# Patient Record
Sex: Male | Born: 1955
Health system: Southern US, Community
[De-identification: ages and names within clinical notes are randomized; demographics above are authoritative.]

## PROBLEM LIST (undated history)

## (undated) DIAGNOSIS — I639 Cerebral infarction, unspecified: Secondary | ICD-10-CM

## (undated) DIAGNOSIS — I10 Essential (primary) hypertension: Secondary | ICD-10-CM

## (undated) DIAGNOSIS — K219 Gastro-esophageal reflux disease without esophagitis: Secondary | ICD-10-CM

## (undated) DIAGNOSIS — E785 Hyperlipidemia, unspecified: Secondary | ICD-10-CM

## (undated) HISTORY — PX: NO PAST SURGERIES: SHX2092

---

## 2012-01-04 ENCOUNTER — Emergency Department (HOSPITAL_COMMUNITY)
Admission: EM | Admit: 2012-01-04 | Discharge: 2012-01-04 | Disposition: A | Discharge status: Home / Self Care | Payer: Self-pay | Attending: Emergency Medicine | Admitting: Emergency Medicine

## 2012-01-04 ENCOUNTER — Encounter (HOSPITAL_COMMUNITY): Payer: Self-pay | Admitting: *Deleted

## 2012-01-04 DIAGNOSIS — I10 Essential (primary) hypertension: Secondary | ICD-10-CM | POA: Insufficient documentation

## 2012-01-04 DIAGNOSIS — R350 Frequency of micturition: Secondary | ICD-10-CM | POA: Insufficient documentation

## 2012-01-04 DIAGNOSIS — N400 Enlarged prostate without lower urinary tract symptoms: Secondary | ICD-10-CM | POA: Insufficient documentation

## 2012-01-04 DIAGNOSIS — Z8673 Personal history of transient ischemic attack (TIA), and cerebral infarction without residual deficits: Secondary | ICD-10-CM | POA: Insufficient documentation

## 2012-01-04 HISTORY — DX: Essential (primary) hypertension: I10

## 2012-01-04 HISTORY — DX: Cerebral infarction, unspecified: I63.9

## 2012-01-04 HISTORY — DX: Hyperlipidemia, unspecified: E78.5

## 2012-01-04 LAB — URINALYSIS, ROUTINE W REFLEX MICROSCOPIC
Glucose, UA: NEGATIVE mg/dL
Hgb urine dipstick: NEGATIVE
Leukocytes, UA: NEGATIVE
Specific Gravity, Urine: 1.022 (ref 1.005–1.030)
Urobilinogen, UA: 1 mg/dL (ref 0.0–1.0)

## 2012-01-04 LAB — DIFFERENTIAL
Basophils Absolute: 0 10*3/uL (ref 0.0–0.1)
Basophils Relative: 1 % (ref 0–1)
Eosinophils Absolute: 0.1 10*3/uL (ref 0.0–0.7)
Eosinophils Relative: 3 % (ref 0–5)
Monocytes Absolute: 0.2 10*3/uL (ref 0.1–1.0)

## 2012-01-04 LAB — BASIC METABOLIC PANEL
GFR calc Af Amer: >90 mL/min (ref >90)
GFR calc non Af Amer: >90 mL/min (ref >90)
Glucose, Bld: 97 mg/dL (ref 70–99)
Potassium: 3.9 mEq/L (ref 3.5–5.1)
Sodium: 137 mEq/L (ref 135–145)

## 2012-01-04 LAB — CBC
Hemoglobin: 13.8 g/dL (ref 13.0–17.0)
MCH: 23.7 pg — ABNORMAL LOW (ref 26.0–34.0)
Platelets: 171 10*3/uL (ref 150–400)
RBC: 5.83 MIL/uL — ABNORMAL HIGH (ref 4.22–5.81)
RDW: 16 % — ABNORMAL HIGH (ref 11.5–15.5)
WBC: 4.4 10*3/uL (ref 4.0–10.5)

## 2012-01-04 MED ORDER — LISINOPRIL-HYDROCHLOROTHIAZIDE 10-12.5 MG PO TABS: 1.0000 | ORAL_TABLET | Freq: Every day | ORAL | Status: DC

## 2012-01-04 NOTE — Discharge Instructions (Signed)
Call [phone] to try to set up an appointment with [facility]. It is very important for you to find a primary care doctor. Recheck your blood pressure in 1 week (you may go to the [facility]). Try to follow up with the urologist as soon as possible.   Hypertension Information As your heart beats, it forces blood through your arteries. This force is your blood pressure. If the pressure is too high, it is called hypertension (HTN) or high blood pressure. HTN is dangerous because you may have it and not know it. High blood pressure may mean that your heart has to work harder to pump blood. Your arteries may be narrow or stiff. The extra work puts you at risk for heart disease, stroke, and other problems.  Blood pressure consists of two numbers, a higher number over a lower, 110/72, for example. It is stated as "110 over 72." The ideal is below 120 for the top number (systolic) and under 80 for the bottom (diastolic).  You should pay close attention to your blood pressure if you have certain conditions such as:  Heart failure.   Prior heart attack.   Diabetes   Chronic kidney disease.   Prior stroke.   Multiple risk factors for heart disease.  To see if you have HTN, your blood pressure should be measured while you are seated with your arm held at the level of the heart. It should be measured at least twice. A one-time elevated blood pressure reading (especially in the Emergency Department) does not mean that you need treatment. There may be conditions in which the blood pressure is different between your right and left arms. It is important to see your caregiver soon for a recheck. Most people have essential hypertension which means that there is not a specific cause. This type of high blood pressure may be lowered by changing lifestyle factors such as:  Stress.   Smoking.   Lack of exercise.   Excessive weight.   Drug/tobacco/alcohol use.    Eating less salt.  Most people do not have symptoms from high blood pressure until it has caused damage to the body. Effective treatment can often prevent, delay or reduce that damage. TREATMENT  Treatment for high blood pressure, when a cause has been identified, is directed at the cause. There are a large number of medications to treat HTN. These fall into several categories, and your caregiver will help you select the medicines that are best for you. Medications may have side effects. You should review side effects with your caregiver. If your blood pressure stays high after you have made lifestyle changes or started on medicines,   Your medication(s) may need to be changed.   Other problems may need to be addressed.   Be certain you understand your prescriptions, and know how and when to take your medicine.   Be sure to follow up with your caregiver within the time frame advised (usually within two weeks) to have your blood pressure rechecked and to review your medications.   If you are taking more than one medicine to lower your blood pressure, make sure you know how and at what times they should be taken. Taking two medicines at the same time can result in blood pressure that is too low.  Document Released: 11/27/2005 Document Revised: 06/06/2011 Document Reviewed: 12/04/2007 [facility] Patient Information 2012 [place], [place].Hypertension Information As your heart beats, it forces blood through your arteries. This force is  your blood pressure. If the pressure is too high, it is called hypertension (HTN) or high blood pressure. HTN is dangerous because you may have it and not know it. High blood pressure may mean that your heart has to work harder to pump blood. Your arteries may be narrow or stiff. The extra work puts you at risk for heart disease, stroke, and other problems.  Blood pressure consists of two numbers, a higher number over a lower, 110/72, for example. It is stated as "110  over 72." The ideal is below 120 for the top number (systolic) and under 80 for the bottom (diastolic).  You should pay close attention to your blood pressure if you have certain conditions such as:  Heart failure.   Prior heart attack.   Diabetes   Chronic kidney disease.   Prior stroke.   Multiple risk factors for heart disease.  To see if you have HTN, your blood pressure should be measured while you are seated with your arm held at the level of the heart. It should be measured at least twice. A one-time elevated blood pressure reading (especially in the Emergency Department) does not mean that you need treatment. There may be conditions in which the blood pressure is different between your right and left arms. It is important to see your caregiver soon for a recheck. Most people have essential hypertension which means that there is not a specific cause. This type of high blood pressure may be lowered by changing lifestyle factors such as:  Stress.   Smoking.   Lack of exercise.   Excessive weight.   Drug/tobacco/alcohol use.   Eating less salt.  Most people do not have symptoms from high blood pressure until it has caused damage to the body. Effective treatment can often prevent, delay or reduce that damage. TREATMENT  Treatment for high blood pressure, when a cause has been identified, is directed at the cause. There are a large number of medications to treat HTN. These fall into several categories, and your caregiver will help you select the medicines that are best for you. Medications may have side effects. You should review side effects with your caregiver. If your blood pressure stays high after you have made lifestyle changes or started on medicines,   Your medication(s) may need to be changed.   Other problems may need to be addressed.   Be certain you understand your prescriptions, and know how and when to take your medicine.   Be sure to follow up with your  caregiver within the time frame advised (usually within two weeks) to have your blood pressure rechecked and to review your medications.   If you are taking more than one medicine to lower your blood pressure, make sure you know how and at what times they should be taken. Taking two medicines at the same time can result in blood pressure that is too low.  Document Released: 11/27/2005 Document Revised: 06/06/2011 Document Reviewed: 12/04/2007 [facility] Patient Information 2012 [place], [place].Hypertension Information As your heart beats, it forces blood through your arteries. This force is your blood pressure. If the pressure is too high, it is called hypertension (HTN) or high blood pressure. HTN is dangerous because you may have it and not know it. High blood pressure may mean that your heart has to work harder to pump blood. Your arteries may be narrow or stiff. The extra work puts you at risk for heart disease, stroke, and other problems.  Blood pressure consists of  two numbers, a higher number over a lower, 110/72, for example. It is stated as "110 over 72." The ideal is below 120 for the top number (systolic) and under 80 for the bottom (diastolic).  You should pay close attention to your blood pressure if you have certain conditions such as:  Heart failure.   Prior heart attack.   Diabetes   Chronic kidney disease.   Prior stroke.   Multiple risk factors for heart disease.  To see if you have HTN, your blood pressure should be measured while you are seated with your arm held at the level of the heart. It should be measured at least twice. A one-time elevated blood pressure reading (especially in the Emergency Department) does not mean that you need treatment. There may be conditions in which the blood pressure is different between your right and left arms. It is important to see your caregiver soon for a recheck. Most people have essential hypertension which means that there is not a  specific cause. This type of high blood pressure may be lowered by changing lifestyle factors such as:  Stress.   Smoking.   Lack of exercise.   Excessive weight.   Drug/tobacco/alcohol use.   Eating less salt.  Most people do not have symptoms from high blood pressure until it has caused damage to the body. Effective treatment can often prevent, delay or reduce that damage. TREATMENT  Treatment for high blood pressure, when a cause has been identified, is directed at the cause. There are a large number of medications to treat HTN. These fall into several categories, and your caregiver will help you select the medicines that are best for you. Medications may have side effects. You should review side effects with your caregiver. If your blood pressure stays high after you have made lifestyle changes or started on medicines,   Your medication(s) may need to be changed.   Other problems may need to be addressed.   Be certain you understand your prescriptions, and know how and when to take your medicine.   Be sure to follow up with your caregiver within the time frame advised (usually within two weeks) to have your blood pressure rechecked and to review your medications.   If you are taking more than one medicine to lower your blood pressure, make sure you know how and at what times they should be taken. Taking two medicines at the same time can result in blood pressure that is too low.  Document Released: 11/27/2005 Document Revised: 06/06/2011 Document Reviewed: 12/04/2007 [facility] Patient Information 2012 [place], [place].Hypertension Information As your heart beats, it forces blood through your arteries. This force is your blood pressure. If the pressure is too high, it is called hypertension (HTN) or high blood pressure. HTN is dangerous because you may have it and not know it. High blood pressure may mean that your heart has to work harder to pump blood. Your arteries may be narrow or  stiff. The extra work puts you at risk for heart disease, stroke, and other problems.  Blood pressure consists of two numbers, a higher number over a lower, 110/72, for example. It is stated as "110 over 72." The ideal is below 120 for the top number (systolic) and under 80 for the bottom (diastolic).  You should pay close attention to your blood pressure if you have certain conditions such as:  Heart failure.   Prior heart attack.   Diabetes   Chronic kidney disease.   Prior  stroke.   Multiple risk factors for heart disease.  To see if you have HTN, your blood pressure should be measured while you are seated with your arm held at the level of the heart. It should be measured at least twice. A one-time elevated blood pressure reading (especially in the Emergency Department) does not mean that you need treatment. There may be conditions in which the blood pressure is different between your right and left arms. It is important to see your caregiver soon for a recheck. Most people have essential hypertension which means that there is not a specific cause. This type of high blood pressure may be lowered by changing lifestyle factors such as:  Stress.   Smoking.   Lack of exercise.   Excessive weight.   Drug/tobacco/alcohol use.   Eating less salt.  Most people do not have symptoms from high blood pressure until it has caused damage to the body. Effective treatment can often prevent, delay or reduce that damage. TREATMENT  Treatment for high blood pressure, when a cause has been identified, is directed at the cause. There are a large number of medications to treat HTN. These fall into several categories, and your caregiver will help you select the medicines that are best for you. Medications may have side effects. You should review side effects with your caregiver. If your blood pressure stays high after you have made lifestyle changes or started on medicines,   Your medication(s) may  need to be changed.   Other problems may need to be addressed.   Be certain you understand your prescriptions, and know how and when to take your medicine.   Be sure to follow up with your caregiver within the time frame advised (usually within two weeks) to have your blood pressure rechecked and to review your medications.   If you are taking more than one medicine to lower your blood pressure, make sure you know how and at what times they should be taken. Taking two medicines at the same time can result in blood pressure that is too low.  Document Released: 11/27/2005 Document Revised: 06/06/2011 Document Reviewed: 12/04/2007 [facility] Patient Information 2012 [place], [place].   Benign Prostatic Hyperplasia You have an enlarged prostate. This is common in elderly males. It is called BPH. This stands for benign prostate hyperplasia. The prostate gland is located in base of the bladder. When it grows, the prostate blocks the urethra. This is the tube which drains urine from the bladder.  SYMPTOMS  Weak urine stream.   Dribbling.   Feeling like the bladder has not emptied completely.   Difficulty starting urination.   Getting up frequently at night to urinate.   Urinating more frequently during the day.  Complete urinary blockage or severe pain with urination requires immediate attention. DIAGNOSIS   Your caregiver often has a good idea what is wrong by taking a history and doing a physical exam.   Special x-rays may be done.  TREATMENT   For mild problems, no treatment may be necessary.   If the problems are moderate, medications may provide relief. Some of these work by making the prostate gland smaller. The herb saw palmetto is commonly used.   If complete blockage occurs, a Foley catheter is usually left in place for a few days.   Surgery is often needed for more severe problems. TURP is the prostate surgery for BPH which is done through the urethra. TURP stands for  transurethral resection of the prostate. It involves  cutting away chips from the prostate. It is done by removing chips so that they can come out through the penis.   Techniques using heat, microwave and laser to remove the prostate blockage are also being used.  HOME CARE INSTRUCTIONS   Give yourself time when you urinate.   Stay away from alcohol.   Beverages containing caffeine such as coffee, tea and colas can make the problems worse.   Decongestants, antihistamines, and some prescription medicines can also make the problem worse.   Follow up with your caregiver for further treatment as recommended.  SEEK IMMEDIATE MEDICAL CARE IF:   You develop increased pain with urination or are unable to pass your water.   You develop severe abdominal pain, vomiting, a high fever, or fainting.   You develop back pain or blood in your urine.  MAKE SURE YOU:   Understand these instructions.   Will watch your condition.   Will get help right away if you are not doing well or get worse.  Document Released: 09/24/2005 Document Revised: 09/13/2011 Document Reviewed: 05/30/2007 [facility] Patient Information 2012 [place], [place].

## 2012-01-04 NOTE — ED Provider Notes (Signed)
History     CSN: [id number]  Arrival date & time 01/04/12  1127   First MD Initiated Contact with Patient 01/04/12 1308      Chief Complaint  Patient presents with  . Urinary Frequency    (Consider location/radiation/quality/duration/timing/severity/associated sxs/prior treatment) HPI Comments: 56 yo male who recently moved to the area presents with several months of increasing urinary frequency. Says he has to go 4-5 times during the day, then 2-3 times at night. No hesistancy, trouble starting or burning/dysuria. No fevers, back pain or abd pain. No penile discharge or scrotal swelling. Does not have urgency or incontinence.  Patient is a 56 y.o. male presenting with frequency. The history is provided by the patient.  Urinary Frequency This is a new problem. Episode onset: several months ago. Episode frequency: between 5-7 times per day. The problem has been unchanged. Associated symptoms include urinary symptoms. Pertinent negatives include no abdominal pain, chest pain, chills, coughing, fever, headaches (occasionally, none now), nausea, numbness, visual change, vomiting or weakness. The symptoms are aggravated by nothing. He has tried nothing for the symptoms.    Past Medical History  Diagnosis Date  . Hypertension   . Hyperlipemia   . Stroke     History reviewed. No pertinent past surgical history.  No family history on file.  History  Substance Use Topics  . Smoking status: Never Smoker   . Smokeless tobacco: Not on file  . Alcohol Use: Yes     varies in amount      Review of Systems  Constitutional: Negative for fever and chills.  Respiratory: Negative for cough and shortness of breath.   Cardiovascular: Negative for chest pain and leg swelling.  Gastrointestinal: Negative for nausea, vomiting, abdominal pain, constipation and blood in stool.  Genitourinary: Positive for frequency. Negative for dysuria, urgency, hematuria, flank pain, decreased urine volume,  discharge, penile swelling, scrotal swelling, difficulty urinating, penile pain and testicular pain.  Neurological: Negative for weakness, numbness and headaches (occasionally, none now).  Psychiatric/Behavioral: Negative for confusion.  All other systems reviewed and are negative.    Allergies  Review of patient's allergies indicates no known allergies.  Home Medications  No current outpatient prescriptions on file.  BP 190/115  Pulse 63  Temp(Src) 98.1 F (36.7 C) (Oral)  Resp 20  Ht 5' 3.5" (1.613 m)  Wt 150 lb (68.04 kg)  BMI 26.15 kg/m2  SpO2 99%  Physical Exam  Nursing note and vitals reviewed. Constitutional: He is oriented to person, place, and time. He appears well-developed and well-nourished.  HENT:  Head: Normocephalic and atraumatic.  Right Ear: External ear normal.  Left Ear: External ear normal.  Nose: Nose normal.  Neck: Neck supple.  Cardiovascular: Normal rate, regular rhythm, normal heart sounds and intact distal pulses.   Pulmonary/Chest: Effort normal and breath sounds normal. No respiratory distress. He has no wheezes. He has no rales.  Abdominal: Soft. He exhibits no distension and no mass. There is no tenderness. There is no rebound and no guarding.  Genitourinary: Penis normal. Prostate is enlarged. Prostate is not tender.  Musculoskeletal: He exhibits no edema.  Lymphadenopathy:    He has no cervical adenopathy.  Neurological: He is alert and oriented to person, place, and time.  Skin: Skin is warm and dry.    ED Course  Procedures (including critical care time)  Labs Reviewed  CBC - Abnormal; Notable for the following:    RBC 5.83 (*)    MCV 71.7 (*)  MCH 23.7 (*)    RDW 16.0 (*)    All other components within normal limits  URINALYSIS, ROUTINE W REFLEX MICROSCOPIC  BASIC METABOLIC PANEL  DIFFERENTIAL   No results found.   1. Urinary frequency   2. Prostatic hypertrophy   3. Hypertension       MDM  56 yo male with  urinary frequency for several months. Daughter encouraged him to come get "checked out". Used to have a PCP but recently moved and so now has no PCP. No signs of infection. Mild prostatic hypertrophy on exam. Hx of hypertension that has been untreated since his move to this town. Otherwise appears well, no obvious distress. Has had intermittent headaches but no focal neuro symptoms or signs. No signs of hypertensive urgency. Will try to set up PCP, as well as get into a urologist, and will start on low dose BP meds to try to get BP controlled in long term.        Pricilla Loveless, MD 01/04/12 [phone]

## 2012-01-04 NOTE — ED Provider Notes (Signed)
Complains of urinary frequency for several months, unchanged no other complaint no other associated symptoms. Patient has long-standing history of hypertension for which she's been noncompliant with medications also has history of stroke. Patient is presently asymptomatic on exam alert awake Glasgow Coma Score 15 no distress. Plan prescription for antihypertensive, right urology referral, referral [facility]. Blood pressure recheck 1 week  Doug Sou, MD 01/04/12 [phone]

## 2012-01-04 NOTE — ED Notes (Signed)
BP 203/118 not 203/181

## 2012-01-04 NOTE — ED Notes (Signed)
Patient reports for the past couple of months he has had urinary frequency post voiding.  Patient states he is voiding normal amount.  Patient denies burning when voiding.  He denies nausea, back pain.

## 2012-01-05 NOTE — ED Provider Notes (Signed)
I have personally seen and examined the patient.  I have discussed the plan of care with the resident.  I have reviewed the documentation on PMH/FH/Soc. History.  I have reviewed the documentation of the resident and agree.  Doug Sou, MD 01/05/12 [phone]

## 2012-04-08 ENCOUNTER — Ambulatory Visit (HOSPITAL_BASED_OUTPATIENT_CLINIC_OR_DEPARTMENT_OTHER): Payer: Self-pay

## 2020-01-03 ENCOUNTER — Ambulatory Visit (INDEPENDENT_AMBULATORY_CARE_PROVIDER_SITE_OTHER): Payer: Self-pay

## 2020-01-03 ENCOUNTER — Encounter: Payer: Self-pay | Admitting: Emergency Medicine

## 2020-01-03 ENCOUNTER — Other Ambulatory Visit: Payer: Self-pay

## 2020-01-03 ENCOUNTER — Ambulatory Visit
Admission: EM | Admit: 2020-01-03 | Discharge: 2020-01-03 | Disposition: A | Discharge status: Home / Self Care | Payer: Self-pay | Attending: Family Medicine | Admitting: Family Medicine

## 2020-01-03 DIAGNOSIS — I10 Essential (primary) hypertension: Secondary | ICD-10-CM | POA: Diagnosis present

## 2020-01-03 DIAGNOSIS — W1839XA Other fall on same level, initial encounter: Secondary | ICD-10-CM

## 2020-01-03 DIAGNOSIS — M25561 Pain in right knee: Secondary | ICD-10-CM

## 2020-01-03 DIAGNOSIS — S8001XA Contusion of right knee, initial encounter: Secondary | ICD-10-CM

## 2020-01-03 MED ORDER — LISINOPRIL-HYDROCHLOROTHIAZIDE 10-12.5 MG PO TABS: 1.0000 | ORAL_TABLET | Freq: Every day | ORAL | 3 refills | Status: DC

## 2020-01-03 MED ORDER — DICLOFENAC SODIUM 75 MG PO TBEC: 75.0000 mg | DELAYED_RELEASE_TABLET | Freq: Two times a day (BID) | ORAL | 0 refills | Status: DC

## 2020-01-03 NOTE — ED Provider Notes (Signed)
[facility]    CSN: [id number] Arrival date & time: 01/03/20  1410      History   Chief Complaint Chief Complaint  Patient presents with  . Knee Pain    HPI Richard Myers is a 64 y.o. male.   64 yo man making his initial visit to [place].. Patient complains of right knee pain which occurred after falling on waxed floor.  He struck the medial aspect of the right knee 4 days ago and every morning has significant pain.  The knee seems to loosen up as the day progresses.     Past Medical History:  Diagnosis Date  . Hyperlipemia   . Hypertension   . Stroke [facility])     Patient Active Problem List   Diagnosis Date Noted  . Essential hypertension 01/03/2020    History reviewed. No pertinent surgical history.     Home Medications    Prior to Admission medications   Medication Sig Start Date End Date Taking? Authorizing Provider  diclofenac (VOLTAREN) 75 MG EC tablet Take 1 tablet (75 mg total) by mouth 2 (two) times daily. 01/03/20   Robyn Haber, MD  lisinopril-hydrochlorothiazide (ZESTORETIC) 10-12.5 MG tablet Take 1 tablet by mouth daily. 01/03/20 01/02/21  Robyn Haber, MD    Family History Family History  Problem Relation Age of Onset  . Arthritis Mother   . Diabetes Mother   . Hypertension Mother   . Alcohol abuse Father     Social History Social History   Tobacco Use  . Smoking status: Never Smoker  . Smokeless tobacco: Never Used  Substance Use Topics  . Alcohol use: Yes    Comment: varies in amount  . Drug use: No     Allergies   Patient has no known allergies.   Review of Systems Review of Systems  Musculoskeletal: Positive for gait problem.  All other systems reviewed and are negative.    Physical Exam Triage Vital Signs ED Triage Vitals  Enc Vitals Group     BP      Pulse      Resp      Temp      Temp src      SpO2      Weight      Height      Head Circumference      Peak Flow      Pain Score      Pain  Loc      Pain Edu?      Excl. in [place]?    No data found.  Updated Vital Signs Pulse 82   Temp 97.9 F (36.6 C) (Temporal)   Resp 16   SpO2 96%    Physical Exam Vitals and nursing note reviewed.  Constitutional:      General: He is not in acute distress.    Appearance: Normal appearance. He is normal weight. He is not toxic-appearing.  HENT:     Head: Normocephalic and atraumatic.  Eyes:     Conjunctiva/sclera: Conjunctivae normal.  Cardiovascular:     Rate and Rhythm: Normal rate.  Pulmonary:     Effort: Pulmonary effort is normal.  Musculoskeletal:        General: Tenderness and signs of injury present. No swelling or deformity. Normal range of motion.     Cervical back: Normal range of motion and neck supple.     Comments: Right knee is tender over medial joint line and lower femoral condyle anteriorly.  No effusion.  Ligaments seem to be intact.  Good ROM  Skin:    General: Skin is warm.     Findings: No bruising.  Neurological:     General: No focal deficit present.     Mental Status: He is alert and oriented to person, place, and time.     Gait: Gait abnormal.  Psychiatric:        Mood and Affect: Mood normal.        Behavior: Behavior normal.        Thought Content: Thought content normal.        Judgment: Judgment normal.   Patient walks slowly favoring right leg.   UC Treatments / Results    Radiology DG Knee Complete 4 Views Right  Result Date: 01/03/2020 CLINICAL DATA:  Patient with medial knee pain status post fall. EXAM: RIGHT KNEE - COMPLETE 4+ VIEW COMPARISON:  None. FINDINGS: Normal anatomic alignment. Corticated ossific density about the fibular head likely chronic in etiology. Mild tricompartmental degenerative changes. Regional soft tissues unremarkable. IMPRESSION: No definite acute fracture. Ossific density about the fibular head likely chronic in etiology. Recommend correlation for point tenderness. Degenerative changes. Electronically Signed    By: Annia Belt M.D.   On: 01/03/2020 14:57    Initial Impression / Assessment and Plan / UC Course  I have reviewed the triage vital signs and the nursing notes.  Pertinent labs & imaging results that were available during my care of the patient were reviewed by me and considered in my medical decision making (see chart for details).    Final Clinical Impressions(s) / UC Diagnoses   Final diagnoses:  Contusion of right knee, initial encounter  Essential hypertension   Discharge Instructions   None    ED Prescriptions    Medication Sig Dispense Auth. Provider   lisinopril-hydrochlorothiazide (ZESTORETIC) 10-12.5 MG tablet Take 1 tablet by mouth daily. 90 tablet Elvina Sidle, MD   diclofenac (VOLTAREN) 75 MG EC tablet Take 1 tablet (75 mg total) by mouth 2 (two) times daily. 14 tablet Elvina Sidle, MD     I have reviewed the PDMP during this encounter.   Elvina Sidle, MD 01/03/20 1500

## 2020-01-03 NOTE — ED Triage Notes (Signed)
Pt presents to [facility] for assessment after slipping and falling on a newly waxed floor 2-3 days ago, falling forward on to his hands and knees.  Patient denies LOC.  C/o right knee pain, swelling, and discomfort, worse in the morning, relieves more through out the day.

## 2020-08-07 ENCOUNTER — Ambulatory Visit (HOSPITAL_COMMUNITY)
Admission: EM | Admit: 2020-08-07 | Discharge: 2020-08-07 | Disposition: A | Discharge status: Home / Self Care | Payer: Medicaid Other

## 2020-08-07 ENCOUNTER — Emergency Department (HOSPITAL_COMMUNITY): Payer: Medicaid Other

## 2020-08-07 ENCOUNTER — Inpatient Hospital Stay (HOSPITAL_COMMUNITY)
Admission: EM | Admit: 2020-08-07 | Discharge: 2020-08-09 | DRG: 066 | Disposition: A | Discharge status: Home Health | Payer: Self-pay | Attending: Internal Medicine | Admitting: Internal Medicine

## 2020-08-07 ENCOUNTER — Observation Stay (HOSPITAL_COMMUNITY): Payer: Medicaid Other

## 2020-08-07 ENCOUNTER — Encounter (HOSPITAL_COMMUNITY): Payer: Self-pay | Admitting: Internal Medicine

## 2020-08-07 ENCOUNTER — Other Ambulatory Visit: Payer: Self-pay

## 2020-08-07 DIAGNOSIS — E785 Hyperlipidemia, unspecified: Secondary | ICD-10-CM | POA: Diagnosis present

## 2020-08-07 DIAGNOSIS — I10 Essential (primary) hypertension: Secondary | ICD-10-CM | POA: Diagnosis present

## 2020-08-07 DIAGNOSIS — Z8249 Family history of ischemic heart disease and other diseases of the circulatory system: Secondary | ICD-10-CM

## 2020-08-07 DIAGNOSIS — Z20822 Contact with and (suspected) exposure to covid-19: Secondary | ICD-10-CM | POA: Diagnosis present

## 2020-08-07 DIAGNOSIS — Z79899 Other long term (current) drug therapy: Secondary | ICD-10-CM

## 2020-08-07 DIAGNOSIS — H538 Other visual disturbances: Secondary | ICD-10-CM | POA: Diagnosis present

## 2020-08-07 DIAGNOSIS — R29700 NIHSS score 0: Secondary | ICD-10-CM | POA: Diagnosis present

## 2020-08-07 DIAGNOSIS — Z7982 Long term (current) use of aspirin: Secondary | ICD-10-CM

## 2020-08-07 DIAGNOSIS — Z23 Encounter for immunization: Secondary | ICD-10-CM

## 2020-08-07 DIAGNOSIS — I6381 Other cerebral infarction due to occlusion or stenosis of small artery: Principal | ICD-10-CM | POA: Diagnosis present

## 2020-08-07 DIAGNOSIS — I639 Cerebral infarction, unspecified: Secondary | ICD-10-CM | POA: Diagnosis present

## 2020-08-07 DIAGNOSIS — H532 Diplopia: Secondary | ICD-10-CM | POA: Diagnosis present

## 2020-08-07 DIAGNOSIS — Z8673 Personal history of transient ischemic attack (TIA), and cerebral infarction without residual deficits: Secondary | ICD-10-CM

## 2020-08-07 LAB — CREATININE, SERUM
Creatinine, Ser: 1.04 mg/dL (ref 0.61–1.24)
GFR, Estimated: >60 mL/min (ref >60)

## 2020-08-07 LAB — COMPREHENSIVE METABOLIC PANEL
ALT: 15 U/L (ref 0–44)
AST: 21 U/L (ref 15–41)
Albumin: 3.8 g/dL (ref 3.5–5.0)
Alkaline Phosphatase: 68 U/L (ref 38–126)
Anion gap: 14 (ref 5–15)
BUN: 18 mg/dL (ref 8–23)
CO2: 22 mmol/L (ref 22–32)
Calcium: 9.1 mg/dL (ref 8.9–10.3)
Chloride: 97 mmol/L — ABNORMAL LOW (ref 98–111)
Creatinine, Ser: 1.1 mg/dL (ref 0.61–1.24)
GFR, Estimated: >60 mL/min (ref >60)
Glucose, Bld: 131 mg/dL — ABNORMAL HIGH (ref 70–99)
Potassium: 3.4 mmol/L — ABNORMAL LOW (ref 3.5–5.1)
Sodium: 133 mmol/L — ABNORMAL LOW (ref 135–145)
Total Bilirubin: 0.6 mg/dL (ref 0.3–1.2)
Total Protein: 7.6 g/dL (ref 6.5–8.1)

## 2020-08-07 LAB — HIV ANTIBODY (ROUTINE TESTING W REFLEX): HIV Screen 4th Generation wRfx: NONREACTIVE

## 2020-08-07 LAB — CBC
HCT: 41.9 % (ref 39.0–52.0)
Hemoglobin: 13.4 g/dL (ref 13.0–17.0)
MCH: 23.5 pg — ABNORMAL LOW (ref 26.0–34.0)
MCHC: 32 g/dL (ref 30.0–36.0)
MCV: 73.5 fL — ABNORMAL LOW (ref 80.0–100.0)
Platelets: 217 10*3/uL (ref 150–400)
RBC: 5.7 MIL/uL (ref 4.22–5.81)
RDW: 15.9 % — ABNORMAL HIGH (ref 11.5–15.5)
WBC: 4.6 10*3/uL (ref 4.0–10.5)
nRBC: 0 % (ref 0.0–0.2)

## 2020-08-07 LAB — RESPIRATORY PANEL BY RT PCR (FLU A&B, COVID)
Influenza A by PCR: NEGATIVE
Influenza B by PCR: NEGATIVE
SARS Coronavirus 2 by RT PCR: NEGATIVE

## 2020-08-07 LAB — PROTIME-INR
INR: 1 (ref 0.8–1.2)
Prothrombin Time: 13.2 seconds (ref 11.4–15.2)

## 2020-08-07 LAB — DIFFERENTIAL
Abs Immature Granulocytes: 0.01 10*3/uL (ref 0.00–0.07)
Basophils Absolute: 0.1 10*3/uL (ref 0.0–0.1)
Basophils Relative: 1 %
Eosinophils Absolute: 0.2 10*3/uL (ref 0.0–0.5)
Eosinophils Relative: 5 %
Immature Granulocytes: 0 %
Lymphocytes Relative: 40 %
Lymphs Abs: 1.8 10*3/uL (ref 0.7–4.0)
Monocytes Absolute: 0.3 10*3/uL (ref 0.1–1.0)
Monocytes Relative: 6 %
Neutro Abs: 2.2 10*3/uL (ref 1.7–7.7)
Neutrophils Relative %: 48 %

## 2020-08-07 LAB — RAPID URINE DRUG SCREEN, HOSP PERFORMED
Amphetamines: NOT DETECTED
Barbiturates: NOT DETECTED
Benzodiazepines: NOT DETECTED
Cocaine: NOT DETECTED
Opiates: NOT DETECTED
Tetrahydrocannabinol: NOT DETECTED

## 2020-08-07 LAB — I-STAT CHEM 8, ED
BUN: 19 mg/dL (ref 8–23)
Calcium, Ion: 1.15 mmol/L (ref 1.15–1.40)
Chloride: 100 mmol/L (ref 98–111)
Creatinine, Ser: 1.2 mg/dL (ref 0.61–1.24)
Glucose, Bld: 134 mg/dL — ABNORMAL HIGH (ref 70–99)
HCT: 43 % (ref 39.0–52.0)
Hemoglobin: 14.6 g/dL (ref 13.0–17.0)
Potassium: 3.5 mmol/L (ref 3.5–5.1)
Sodium: 139 mmol/L (ref 135–145)
TCO2: 26 mmol/L (ref 22–32)

## 2020-08-07 LAB — TROPONIN I (HIGH SENSITIVITY): Troponin I (High Sensitivity): 4 ng/L (ref <18)

## 2020-08-07 LAB — VITAMIN B12: Vitamin B-12: 606 pg/mL (ref 180–914)

## 2020-08-07 LAB — APTT: aPTT: 26 seconds (ref 24–36)

## 2020-08-07 MED ORDER — PANTOPRAZOLE SODIUM 40 MG IV SOLR
40.0000 mg | INTRAVENOUS | Status: DC
  Administered 2020-08-07 – 2020-08-08 (×2): 40 mg via INTRAVENOUS
  Filled 2020-08-07 (×2): qty 40

## 2020-08-07 MED ORDER — STROKE: EARLY STAGES OF RECOVERY BOOK
Freq: Once | Status: AC
  Filled 2020-08-07: qty 1

## 2020-08-07 MED ORDER — ASPIRIN 300 MG RE SUPP: 300.0000 mg | Freq: Every day | RECTAL | Status: DC

## 2020-08-07 MED ORDER — HYDRALAZINE HCL 20 MG/ML IJ SOLN
10.0000 mg | Freq: Four times a day (QID) | INTRAMUSCULAR | Status: DC | PRN
  Filled 2020-08-07: qty 1

## 2020-08-07 MED ORDER — ASPIRIN 325 MG PO TABS: 325.0000 mg | ORAL_TABLET | Freq: Every day | ORAL | Status: DC

## 2020-08-07 MED ORDER — SODIUM CHLORIDE 0.9 % IV SOLN: INTRAVENOUS | Status: DC

## 2020-08-07 MED ORDER — ASPIRIN 325 MG PO TABS
325.0000 mg | ORAL_TABLET | Freq: Every day | ORAL | Status: DC
  Administered 2020-08-07 – 2020-08-08 (×2): 325 mg via ORAL
  Filled 2020-08-07 (×2): qty 1

## 2020-08-07 MED ORDER — HEPARIN SODIUM (PORCINE) 5000 UNIT/ML IJ SOLN
5000.0000 [IU] | Freq: Three times a day (TID) | INTRAMUSCULAR | Status: DC
  Administered 2020-08-07 – 2020-08-09 (×5): 5000 [IU] via SUBCUTANEOUS
  Filled 2020-08-07 (×5): qty 1

## 2020-08-07 MED ORDER — ATORVASTATIN CALCIUM 40 MG PO TABS
40.0000 mg | ORAL_TABLET | Freq: Every day | ORAL | Status: DC
  Administered 2020-08-07 – 2020-08-09 (×3): 40 mg via ORAL
  Filled 2020-08-07 (×3): qty 1

## 2020-08-07 MED ORDER — ACETAMINOPHEN 325 MG PO TABS: 650.0000 mg | ORAL_TABLET | ORAL | Status: DC | PRN

## 2020-08-07 MED ORDER — THIAMINE HCL 100 MG/ML IJ SOLN
100.0000 mg | Freq: Every day | INTRAMUSCULAR | Status: DC
  Administered 2020-08-07 – 2020-08-09 (×3): 100 mg via INTRAVENOUS
  Filled 2020-08-07 (×3): qty 2

## 2020-08-07 MED ORDER — ACETAMINOPHEN 160 MG/5ML PO SOLN: 650.0000 mg | ORAL | Status: DC | PRN

## 2020-08-07 MED ORDER — ACETAMINOPHEN 650 MG RE SUPP: 650.0000 mg | RECTAL | Status: DC | PRN

## 2020-08-07 NOTE — ED Triage Notes (Signed)
Pt coming from home. Pt presents with double vision. Pt states had same thing occur two weeks ago, took his BP medicine and it went away. Pt states hes been fine since and yesterday started having to double vision again. VSS. NAD.

## 2020-08-07 NOTE — ED Notes (Signed)
Patient transported to MRI 

## 2020-08-07 NOTE — H&P (Signed)
History and Physical    Kendricks Reap CHE:[id number] DOB: Jun 26, 1956 DOA: 08/07/2020  PCP: Patient, No Pcp Per    Patient coming from:  Home   Chief Complaint:  Double vision.   HPI: Richard Myers is a 64 y.o. male with medical history significant of HTN and cva in past comes for double vision.He reports that his diplopia today started today am and he did have an episode of it a week ago when he was washing dishes where he has completely blurred vision when he took his bp med and resolved. He see double and described images on top of each other suggestive of vertical diplopia which resolves  With Closing each eye and therefore had a patch. Pt states he does not have health insurance and therefor no pcp. He does binge drink wthi not history of withdrawal eo seizures  And last night he has two gin's but no drugs or smoking  Ever.nuerology has seen pt in ed.Med reviewed with him he takes baby asa and his bp meds daily no Voltaren or any nsaids for past two years. Pt is alert and wake and gives history.Vitals ares table and is on Voltaren at home.   ED Course:  Blood pressure (!) 178/91, pulse 63, temperature 98.3 F (36.8 C), temperature source Oral, resp. rate 16, height 5' 3.5" (1.613 m), weight 77.1 kg, SpO2 97 %. SpO2: 97 % Labs shows covid-pending /otherwise cmp is wnl and low potassium is  replaced and resolved.  Review of Systems: As per HPI otherwise all systems reviewed and negative.  Past Medical History:  Diagnosis Date  . Hyperlipemia   . Hypertension   . Stroke [facility])     History reviewed. No pertinent surgical history.   reports that he has never smoked. He has never used smokeless tobacco. He reports current alcohol use. He reports that he does not use drugs.  No Known Allergies  Family History  Problem Relation Age of Onset  . Arthritis Mother   . Diabetes Mother   . Hypertension Mother   . Alcohol abuse Father     Prior to Admission medications     Medication Sig Start Date End Date Taking? Authorizing Provider  diclofenac (VOLTAREN) 75 MG EC tablet Take 1 tablet (75 mg total) by mouth 2 (two) times daily. 01/03/20   Elvina Sidle, MD  lisinopril-hydrochlorothiazide (ZESTORETIC) 10-12.5 MG tablet Take 1 tablet by mouth daily. 01/03/20 01/02/21  Elvina Sidle, MD    Physical Exam: Vitals:   08/07/20 1156 08/07/20 1158 08/07/20 1542  BP: (!) 156/94  (!) 178/91  Pulse: 94  63  Resp: 16  16  Temp: 98.3 F (36.8 C)    TempSrc: Oral    SpO2: 95%  97%  Weight:  77.1 kg   Height:  5' 3.5" (1.613 m)     Constitutional: NAD, calm, comfortable Vitals:   08/07/20 1156 08/07/20 1158 08/07/20 1542  BP: (!) 156/94  (!) 178/91  Pulse: 94  63  Resp: 16  16  Temp: 98.3 F (36.8 C)    TempSrc: Oral    SpO2: 95%  97%  Weight:  77.1 kg   Height:  5' 3.5" (1.613 m)     Labs on Admission: I have personally reviewed following labs and imaging studies  CBC: Recent Labs  Lab 08/07/20 1224 08/07/20 1233  WBC 4.6  --   NEUTROABS 2.2  --   HGB 13.4 14.6  HCT 41.9 43.0  MCV 73.5*  --  PLT 217  --    Basic Metabolic Panel: Recent Labs  Lab 08/07/20 1224 08/07/20 1233  NA 133* 139  K 3.4* 3.5  CL 97* 100  CO2 22  --   GLUCOSE 131* 134*  BUN 18 19  CREATININE 1.10 1.20  CALCIUM 9.1  --    GFR: Estimated Creatinine Clearance: 57.8 mL/min (by C-G formula based on SCr of 1.2 mg/dL). Liver Function Tests: Recent Labs  Lab 08/07/20 1224  AST 21  ALT 15  ALKPHOS 68  BILITOT 0.6  PROT 7.6  ALBUMIN 3.8   No results for input(s): LIPASE, AMYLASE in the last 168 hours. No results for input(s): AMMONIA in the last 168 hours. Coagulation Profile: Recent Labs  Lab 08/07/20 1224  INR 1.0   Cardiac Enzymes: No results for input(s): CKTOTAL, CKMB, CKMBINDEX, TROPONINI in the last 168 hours. BNP (last 3 results) No results for input(s): PROBNP in the last 8760 hours. HbA1C: No results for input(s): HGBA1C in the last  72 hours. CBG: No results for input(s): GLUCAP in the last 168 hours. Lipid Profile: No results for input(s): CHOL, HDL, LDLCALC, TRIG, CHOLHDL, LDLDIRECT in the last 72 hours. Thyroid Function Tests: No results for input(s): TSH, T4TOTAL, FREET4, T3FREE, THYROIDAB in the last 72 hours. Anemia Panel: No results for input(s): VITAMINB12, FOLATE, FERRITIN, TIBC, IRON, RETICCTPCT in the last 72 hours. Urine analysis:    Component Value Date/Time   COLORURINE YELLOW 01/04/2012 1240   APPEARANCEUR CLEAR 01/04/2012 1240   LABSPEC 1.022 01/04/2012 1240   PHURINE 7.0 01/04/2012 1240   GLUCOSEU NEGATIVE 01/04/2012 1240   HGBUR NEGATIVE 01/04/2012 1240   BILIRUBINUR NEGATIVE 01/04/2012 1240   KETONESUR NEGATIVE 01/04/2012 1240   PROTEINUR NEGATIVE 01/04/2012 1240   UROBILINOGEN 1.0 01/04/2012 1240   NITRITE NEGATIVE 01/04/2012 1240   LEUKOCYTESUR NEGATIVE 01/04/2012 1240   No intake or output data in the 24 hours ending 08/07/20 1634 Lab Results  Component Value Date   CREATININE 1.20 08/07/2020   CREATININE 1.10 08/07/2020   CREATININE 0.89 01/04/2012    COVID-19 Labs  No results for input(s): DDIMER, FERRITIN, LDH, CRP in the last 72 hours.  No results found for: SARSCOV2NAA  Radiological Exams on Admission: CT HEAD WO CONTRAST  Result Date: 08/07/2020 CLINICAL DATA:  Double vision for 2 weeks. EXAM: CT HEAD WITHOUT CONTRAST TECHNIQUE: Contiguous axial images were obtained from the base of the skull through the vertex without intravenous contrast. COMPARISON:  None. FINDINGS: Brain: The ventricles are normal in size and configuration. No extra-axial fluid collections are identified. The gray-white differentiation is maintained. No CT findings for acute hemispheric infarction or intracranial hemorrhage. No mass lesions. The brainstem and cerebellum are normal. Vascular: Scattered vascular calcifications. No hyperdense vessels or obvious aneurysm. Skull: No acute skull fracture.  No  bone lesion. Sinuses/Orbits: Scattered mucoperiosteal thickening involving the ethmoid and maxillary sinuses. The mastoid air cells and middle ear cavities are clear. The globes are intact. Other: No scalp lesions, laceration or hematoma. IMPRESSION: 1. No acute intracranial findings or mass lesions. 2. Ethmoid and maxillary sinus disease. Electronically Signed   By: Rudie Meyer M.D.   On: 08/07/2020 13:32   MR BRAIN WO CONTRAST  Result Date: 08/07/2020 CLINICAL DATA:  Diplopia EXAM: MRI HEAD WITHOUT CONTRAST TECHNIQUE: Multiplanar, multiecho pulse sequences of the brain and surrounding structures were obtained without intravenous contrast. COMPARISON:  08/07/2020 head CT. FINDINGS: Brain: 2-3 mm focal restricted diffusion involving the medial right mid brain. Remote microhemorrhages involving  the right frontal white matter and bilateral thalami. Remote lacunar insults involving the bilateral basal ganglia. Remote pontine insults. Mild cerebral atrophy with ex vacuo dilatation mild chronic microvascular ischemic changes. No midline shift, ventriculomegaly or extra-axial fluid collection. No mass lesion. Vascular: Mid right M1 segment T2 hyperintense signal may reflect slow flow versus high-grade narrowing. Remaining major intracranial flow voids are proximally preserved. Skull and upper cervical spine: Normal marrow signal. Sinuses/Orbits: Normal orbits. Mild pansinus mucosal thickening. Pneumatized mastoid air cells. Other: None. IMPRESSION: 3 mm medial right mid brain acute infarct. Remote lacunar insults involving the bilateral basal ganglia and pons. Remote right frontal white matter and thalamic microhemorrhages. Mild cerebral atrophy and chronic microvascular ischemic changes. Mid right M1 segment T2 hyperintensity, slow flow versus high-grade narrowing. These results were called by telephone at the time of interpretation on 08/07/2020 at 2:33 pm to provider [facility], who verbally acknowledged  these results. Electronically Signed   By: Stana Bunting M.D.   On: 08/07/2020 14:38    EKG: Independently reviewed.  Sinus tach 103 and twi in lead III and v6.   Assessment/Plan Binocular vision disorder with diplopia: Attribute to his acute midbrain infarct and would recommend ophthalmology consult. His presentation vertical diplopia. We can patch one eye in the meantime and alternate patch every 4 hours until seen by Eye MD.  Acute CVA (cerebrovascular accident) (HCC) Pt has been on asa at home and we will start pt on dual antiplatelet therapy or per neuro recommendation. High intensity statin,therapy, PT/OT consult. 2 D Echo with bubble. Permissive HTN. Greatly appreciate neurology consult. -ana/rpr  Essential hypertension -Cardiac low sodium, heart healthy. -hold diuretic ant ihtn therapy to continue perfusion.     Dyslipidemia -Am lipid panel. -high intensity statin therapy.  TWI in EKG  Will also obtain troponin     DVT prophylaxis:  Heparin  Code Status:  Full code   Family Communication:  None at bedside   Disposition Plan:  Home   Consults called:  neurology  Admission status: Observation   Gertha Calkin MD [facility] Pager [phone] If 7PM-7AM, please contact night-coverage [email or web address] Password [facility] 08/07/2020, 4:34 PM

## 2020-08-07 NOTE — ED Notes (Signed)
Due to pt's vision being blurred, he is unable to ambulate around the room at this time.

## 2020-08-07 NOTE — Consult Note (Addendum)
Neurology Consultation  Reason for Consult: stroke  Referring Physician: Dr. Criss Alvine  CC: double vision for 2 weeks   History is obtained from: patient  HPI: Richard Myers is a 64 y.o. male with past medical history of HTN, HLD and stroke in past who presents to the ED for double vision which has been occurring off and on for the last 2 weeks.  He states 2 weeks ago it first happened and he thought it was related to high blood pressure at which time he took his blood pressure medication and it resolved.  However, the double vision has returned and has been consistent over the last few days.  He states that he was not able to see so he decided to apply a patch to cover the eye, which did help the double vision.  He states that he can cover either eye and the vision is much improved. Patient does not have a PCP and has not seen one in years.  He denies HA, CP, SOB, numbness/tingling, weakness, speech or gait disturbances Patient is lying in bed, alert and oriented in no distress. Ct head revealed no acute abnormality. MRI head revealed acute right mid brain infarct.  Neurology called for acute stroke on MRI and stroke workup   LKW: 2 weeks ago  tpa given?: no, outside window for TPA  Premorbid modified Rankin scale (mRS):  1-No significant post stroke disability and can perform usual duties with stroke symptoms   ROS: A 14 point ROS was performed and is negative except as noted in the HPI.   Past Medical History:  Diagnosis Date  . Hyperlipemia   . Hypertension   . Stroke [facility])     Essential (primary) hypertension   Family History  Problem Relation Age of Onset  . Arthritis Mother   . Diabetes Mother   . Hypertension Mother   . Alcohol abuse Father      Social History:   reports that he has never smoked. He has never used smokeless tobacco. He reports current alcohol use. He reports that he does not use drugs.  Medications No current facility-administered medications for  this encounter.  Current Outpatient Medications:  .  diclofenac (VOLTAREN) 75 MG EC tablet, Take 1 tablet (75 mg total) by mouth 2 (two) times daily., Disp: 14 tablet, Rfl: 0 .  lisinopril-hydrochlorothiazide (ZESTORETIC) 10-12.5 MG tablet, Take 1 tablet by mouth daily., Disp: 90 tablet, Rfl: 3   Exam: Current vital signs: BP (!) 156/94 (BP Location: Right Arm)   Pulse 94   Temp 98.3 F (36.8 C) (Oral)   Resp 16   Ht 5' 3.5" (1.613 m)   Wt 77.1 kg   SpO2 95%   BMI 29.64 kg/m  Vital signs in last 24 hours: Temp:  [98.3 F (36.8 C)] 98.3 F (36.8 C) (10/31 1156) Pulse Rate:  [94] 94 (10/31 1156) Resp:  [16] 16 (10/31 1156) BP: (156)/(94) 156/94 (10/31 1156) SpO2:  [95 %] 95 % (10/31 1156) Weight:  [77.1 kg] 77.1 kg (10/31 1158)  GENERAL: Awake, alert in NAD HENT: - Normocephalic and atraumatic, dry mm, no LN++, no Thyromegally Eyes: normal conjunctiva LUNGS - Clear to auscultation bilaterally with no wheezes CV - S1S2 RRR, no m/r/g, equal pulses bilaterally. ABDOMEN - Soft, nontender, nondistended with normoactive BS Ext: warm, well perfused, intact peripheral pulses, no edema  NEURO:  Mental Status: AA&Ox3  Language: speech is clear.  Naming, repetition, fluency, and comprehension intact. Cranial Nerves: PERRL 75mm/brisk.  He has  a slightly disconjugate gaze, visual fields full, no facial asymmetry, facial sensation intact, hearing intact, tongue/uvula/soft palate midline, normal sternocleidomastoid and trapezius muscle strength. No evidence of tongue atrophy or fibrillations Motor: 5/5 in all 4 extremities Tone: is normal and bulk is normal Sensation- Intact to light touch bilaterally Coordination: ataxia on right side with FTN Gait- deferred  NIHSS 1a Level of Conscious.: alert 0 1b LOC Questions: both correct 0 1c LOC Commands: both correct 0 2 Best Gaze: normal 0 3 Visual: no visual loss 0 4 Facial Palsy: normal 0 5a Motor Arm - left: no drift 0 5b Motor Arm -  Right: no drift 0 6a Motor Leg - Left: no drift 0 6b Motor Leg - Right: no drift 0 7 Limb Ataxia: present 1 8 Sensory: normal 0 9 Best Language: normal 0 10 Dysarthria: normal 0 11 Extinct. and Inatten.: none 0 TOTAL: 1   Labs I have reviewed labs in epic and the results pertinent to this consultation are:  CBC    Component Value Date/Time   WBC 4.6 08/07/2020 1224   RBC 5.70 08/07/2020 1224   HGB 14.6 08/07/2020 1233   HCT 43.0 08/07/2020 1233   PLT 217 08/07/2020 1224   MCV 73.5 (L) 08/07/2020 1224   MCH 23.5 (L) 08/07/2020 1224   MCHC 32.0 08/07/2020 1224   RDW 15.9 (H) 08/07/2020 1224   LYMPHSABS 1.8 08/07/2020 1224   MONOABS 0.3 08/07/2020 1224   EOSABS 0.2 08/07/2020 1224   BASOSABS 0.1 08/07/2020 1224    CMP     Component Value Date/Time   NA 139 08/07/2020 1233   K 3.5 08/07/2020 1233   CL 100 08/07/2020 1233   CO2 22 08/07/2020 1224   GLUCOSE 134 (H) 08/07/2020 1233   BUN 19 08/07/2020 1233   CREATININE 1.20 08/07/2020 1233   CALCIUM 9.1 08/07/2020 1224   PROT 7.6 08/07/2020 1224   ALBUMIN 3.8 08/07/2020 1224   AST 21 08/07/2020 1224   ALT 15 08/07/2020 1224   ALKPHOS 68 08/07/2020 1224   BILITOT 0.6 08/07/2020 1224   GFRNONAA >60 08/07/2020 1224   GFRAA >90 01/04/2012 1315    Lipid Panel  No results found for: CHOL, TRIG, HDL, CHOLHDL, VLDL, LDLCALC, LDLDIRECT   Imaging I have reviewed the images obtained:  CT-scan of the brain no acute abnormality   MRI examination of the brain  3 mm medial right mid brain acute infarct Remote lacunar insults involving the bilateral basal ganglia and pons. Remote right frontal white matter and thalamic microhemorrhages. Mild cerebral atrophy and chronic microvascular ischemic changes. Mid right M1 segment T2 hyperintensity, slow flow versus high-grade narrowing.  Gevena Mart, DNP    Assessment:  Richard Myers is a 64 y.o. male with past medical history of HTN, HLD and stroke in past who presents to  the ED for double vision which started yesterday, and was found to have an acute midbrain stroke. I suspect that he had a TIA 2 weeks ago.   Recommendations: - Admit for stroke workup - HgbA1c, fasting lipid panel - Frequent neuro checks - Goal SBP <180 - maintain normoglycemia - Echocardiogram - CTA head and neck - Prophylactic therapy-Antiplatelet med: Aspirin - 81mg  and plavix 75mg  daily after 300mg  load for 3 weeks.  -start atorvastatin 40 mg daily PO  - Risk factor modification - Telemetry monitoring - PT consult, OT consult, Speech consult - Stroke team to follow  , MD [facility] [phone]  If 7pm- 7am, please  page neurology on call as listed in [place].

## 2020-08-07 NOTE — TOC Initial Note (Signed)
AT[facility] Care [facility]) - Initial/Assessment Note    Patient Details  Name: Randale Carvalho MRN: [id number] Date of Birth: [birth year]  Transition of Care [facility]) CM/SW Contact:    Lockie Pares, RN Phone Number: 08/07/2020, 3:33 PM  Clinical Narrative:                 Assigned to [facility] for PCP follow up. Patient can start application in November to Medicare it does depend on years worked credits built. While admitted it may be a good idea to consult FC to assist him in this and any other items that may help him     Barriers to Discharge:  (poor follow up , no primary care listed)   Patient Goals and CMS Choice        Expected Discharge Plan and Services                                                Prior Living Arrangements/Services                       Activities of Daily Living      Permission Sought/Granted                  Emotional Assessment              Admission diagnosis:  blurry vision Patient Active Problem List   Diagnosis Date Noted  . Diplopia 08/07/2020  . Acute CVA (cerebrovascular accident) (HCC) 08/07/2020  . Dyslipidemia 08/07/2020  . Essential hypertension 01/03/2020   PCP:  Patient, No Pcp Per Pharmacy:   [id number] - [place], [place] - [address] [address] Phone: [phone] Fax: [phone]     Social Determinants of Health (SDOH) Interventions    Readmission Risk Interventions No flowsheet data found.

## 2020-08-07 NOTE — ED Provider Notes (Signed)
[facility] Provider Note   CSN: [id number] Arrival date & time: 08/07/20  1144     History Chief Complaint  Patient presents with  . Diplopia    Richard Myers is a 64 y.o. male.  HPI Patient is a 64 year old male with a history of hyperlipidemia, hypertension, CVA. Patient presents today due to diplopia. States his symptoms started yesterday. States that he has been experiencing constant diplopia with blurry vision but states that covering 1 eye seems to alleviate his symptoms. Patient states that he was having difficulty using the telephone this morning when dialing due to his sx. Reports similar symptoms about 2 weeks ago. States that he took a dose of his blood pressure medications and then took a nap for about an hour and his symptoms alleviated. He is not anticoagulated. No headaches, eye pain, chest pain, shortness of breath, nausea, vomiting, urinary changes, leg swelling.    Past Medical History:  Diagnosis Date  . Hyperlipemia   . Hypertension   . Stroke [facility](HCC)     Patient Active Problem List   Diagnosis Date Noted  . Essential hypertension 01/03/2020    No past surgical history on file.     Family History  Problem Relation Age of Onset  . Arthritis Mother   . Diabetes Mother   . Hypertension Mother   . Alcohol abuse Father     Social History   Tobacco Use  . Smoking status: Never Smoker  . Smokeless tobacco: Never Used  Substance Use Topics  . Alcohol use: Yes    Comment: varies in amount  . Drug use: No    Home Medications Prior to Admission medications   Medication Sig Start Date End Date Taking? Authorizing Provider  diclofenac (VOLTAREN) 75 MG EC tablet Take 1 tablet (75 mg total) by mouth 2 (two) times daily. 01/03/20   Elvina SidleLauenstein, Kurt, MD  lisinopril-hydrochlorothiazide (ZESTORETIC) 10-12.5 MG tablet Take 1 tablet by mouth daily. 01/03/20 01/02/21  Elvina SidleLauenstein, Kurt, MD    Allergies    Patient has  no known allergies.  Review of Systems   Review of Systems  All other systems reviewed and are negative. Ten systems reviewed and are negative for acute change, except as noted in the HPI.   Physical Exam Updated Vital Signs BP (!) 156/94 (BP Location: Right Arm)   Pulse 94   Temp 98.3 F (36.8 C) (Oral)   Resp 16   Ht 5' 3.5" (1.613 m)   Wt 77.1 kg   SpO2 95%   BMI 29.64 kg/m   Physical Exam Vitals and nursing note reviewed.  Constitutional:      General: He is not in acute distress.    Appearance: Normal appearance. He is not ill-appearing, toxic-appearing or diaphoretic.  HENT:     Head: Normocephalic and atraumatic.     Right Ear: External ear normal.     Left Ear: External ear normal.     Nose: Nose normal.     Mouth/Throat:     Mouth: Mucous membranes are moist.     Pharynx: Oropharynx is clear. No oropharyngeal exudate or posterior oropharyngeal erythema.  Eyes:     General: No scleral icterus.       Right eye: No discharge.        Left eye: No discharge.     Extraocular Movements: Extraocular movements intact.     Conjunctiva/sclera: Conjunctivae normal.     Comments: Extraocular movements intact. Eyes are noninjected.  With  both eyes uncovered patient states he sees 4 fingers when I am holding up two. With either eye covered, patient can correctly tell me the number of fingers I am holding up. Consistent with binocular diplopia.  Cardiovascular:     Rate and Rhythm: Normal rate and regular rhythm.     Pulses: Normal pulses.     Heart sounds: Normal heart sounds. No murmur heard.  No friction rub. No gallop.   Pulmonary:     Effort: Pulmonary effort is normal. No respiratory distress.     Breath sounds: Normal breath sounds. No stridor. No wheezing, rhonchi or rales.  Abdominal:     General: Abdomen is flat.     Tenderness: There is no abdominal tenderness.  Musculoskeletal:        General: Normal range of motion.     Cervical back: Normal range of motion  and neck supple. No tenderness.  Skin:    General: Skin is warm and dry.  Neurological:     General: No focal deficit present.     Mental Status: He is alert and oriented to person, place, and time.     Comments: Patient is oriented to person, place, and time. Patient phonates in clear, complete, and coherent sentences. Negative arm drift. Finger to nose intact bilaterally with no visible signs of dysmetria. Strength is 5/5 in all four extremities. Distal sensation intact in all four extremities.  Psychiatric:        Mood and Affect: Mood normal.        Behavior: Behavior normal.    ED Results / Procedures / Treatments   Labs (all labs ordered are listed, but only abnormal results are displayed) Labs Reviewed  CBC - Abnormal; Notable for the following components:      Result Value   MCV 73.5 (*)    MCH 23.5 (*)    RDW 15.9 (*)    All other components within normal limits  COMPREHENSIVE METABOLIC PANEL - Abnormal; Notable for the following components:   Sodium 133 (*)    Potassium 3.4 (*)    Chloride 97 (*)    Glucose, Bld 131 (*)    All other components within normal limits  I-STAT CHEM 8, ED - Abnormal; Notable for the following components:   Glucose, Bld 134 (*)    All other components within normal limits  RESPIRATORY PANEL BY RT PCR (FLU A&B, COVID)  PROTIME-INR  APTT  DIFFERENTIAL  RAPID URINE DRUG SCREEN, HOSP PERFORMED  CBG MONITORING, ED   EKG EKG Interpretation  Date/Time:  Sunday August 07 2020 11:48:49 EDT Ventricular Rate:  102 PR Interval:  124 QRS Duration: 84 QT Interval:  352 QTC Calculation: 458 R Axis:   82 Text Interpretation: Sinus tachycardia T wave abnormality, consider inferior ischemia No old tracing to compare Confirmed by Pricilla Loveless [phone]) on 08/07/2020 12:53:51 PM  Radiology CT HEAD WO CONTRAST  Result Date: 08/07/2020 CLINICAL DATA:  Double vision for 2 weeks. EXAM: CT HEAD WITHOUT CONTRAST TECHNIQUE: Contiguous axial images  were obtained from the base of the skull through the vertex without intravenous contrast. COMPARISON:  None. FINDINGS: Brain: The ventricles are normal in size and configuration. No extra-axial fluid collections are identified. The gray-white differentiation is maintained. No CT findings for acute hemispheric infarction or intracranial hemorrhage. No mass lesions. The brainstem and cerebellum are normal. Vascular: Scattered vascular calcifications. No hyperdense vessels or obvious aneurysm. Skull: No acute skull fracture.  No bone lesion. Sinuses/Orbits: Scattered mucoperiosteal thickening  involving the ethmoid and maxillary sinuses. The mastoid air cells and middle ear cavities are clear. The globes are intact. Other: No scalp lesions, laceration or hematoma. IMPRESSION: 1. No acute intracranial findings or mass lesions. 2. Ethmoid and maxillary sinus disease. Electronically Signed   By: Rudie Meyer M.D.   On: 08/07/2020 13:32   MR BRAIN WO CONTRAST  Result Date: 08/07/2020 CLINICAL DATA:  Diplopia EXAM: MRI HEAD WITHOUT CONTRAST TECHNIQUE: Multiplanar, multiecho pulse sequences of the brain and surrounding structures were obtained without intravenous contrast. COMPARISON:  08/07/2020 head CT. FINDINGS: Brain: 2-3 mm focal restricted diffusion involving the medial right mid brain. Remote microhemorrhages involving the right frontal white matter and bilateral thalami. Remote lacunar insults involving the bilateral basal ganglia. Remote pontine insults. Mild cerebral atrophy with ex vacuo dilatation mild chronic microvascular ischemic changes. No midline shift, ventriculomegaly or extra-axial fluid collection. No mass lesion. Vascular: Mid right M1 segment T2 hyperintense signal may reflect slow flow versus high-grade narrowing. Remaining major intracranial flow voids are proximally preserved. Skull and upper cervical spine: Normal marrow signal. Sinuses/Orbits: Normal orbits. Mild pansinus mucosal thickening.  Pneumatized mastoid air cells. Other: None. IMPRESSION: 3 mm medial right mid brain acute infarct. Remote lacunar insults involving the bilateral basal ganglia and pons. Remote right frontal white matter and thalamic microhemorrhages. Mild cerebral atrophy and chronic microvascular ischemic changes. Mid right M1 segment T2 hyperintensity, slow flow versus high-grade narrowing. These results were called by telephone at the time of interpretation on 08/07/2020 at 2:33 pm to provider [facility], who verbally acknowledged these results. Electronically Signed   By: Stana Bunting M.D.   On: 08/07/2020 14:38   Procedures Procedures (including critical care time)  Medications Ordered in ED Medications   stroke: mapping our early stages of recovery book (has no administration in time range)  aspirin suppository 300 mg (has no administration in time range)    Or  aspirin tablet 325 mg (has no administration in time range)  atorvastatin (LIPITOR) tablet 40 mg (has no administration in time range)    ED Course  I have reviewed the triage vital signs and the nursing notes.  Pertinent labs & imaging results that were available during my care of the patient were reviewed by me and considered in my medical decision making (see chart for details).  Clinical Course as of Aug 07 1600  Wynelle Link Aug 07, 2020  1340 Spoke to Dr. Amada Jupiter with neurology.  Recommends MRI.  If negative and no evidence of microhemorrhage, discharged on baby aspirin and have him follow-up with ophthalmology.   [LJ]  1439 Acute right midbrain infarct   [LJ]    Clinical Course User Index [LJ] Placido Sou, PA-C   MDM Rules/Calculators/A&P                          Patient is a 64 year old male who presents to the emergency department for 1 day of diplopia and blurry vision.  Physical exam is significant for binocular diplopia.  He was discussed with Dr. Amada Jupiter with neurology who recommended follow-up MRI.   This was obtained showing an acute right midbrain infarct.  I reconsulted Dr. Amada Jupiter who recommends admission to medicine and he will consult.  Patient has been discussed with the medicine team who will accept him into their care at this time.  Patient is aware that he will be admitted and he is amenable with this plan.  Respiratory panel has been ordered.  Note: Portions of this report may have been transcribed using voice recognition software. Every effort was made to ensure accuracy; however, inadvertent computerized transcription errors may be present.   Final Clinical Impression(s) / ED Diagnoses Final diagnoses:  Binocular vision disorder with diplopia  Cerebrovascular accident (CVA), unspecified mechanism [facility])   Rx / DC Orders ED Discharge Orders    None       Placido Sou, PA-C 08/07/20 1614    Pricilla Loveless, MD 08/07/20 1724

## 2020-08-08 ENCOUNTER — Observation Stay (HOSPITAL_COMMUNITY): Payer: Medicaid Other

## 2020-08-08 DIAGNOSIS — E785 Hyperlipidemia, unspecified: Secondary | ICD-10-CM

## 2020-08-08 DIAGNOSIS — I639 Cerebral infarction, unspecified: Secondary | ICD-10-CM

## 2020-08-08 DIAGNOSIS — I6389 Other cerebral infarction: Secondary | ICD-10-CM

## 2020-08-08 DIAGNOSIS — H532 Diplopia: Secondary | ICD-10-CM

## 2020-08-08 LAB — ECHOCARDIOGRAM COMPLETE
Area-P 1/2: 2.37 cm2
Calc EF: 60.1 %
Height: 63.5 in
S' Lateral: 2.5 cm
Single Plane A2C EF: 65.2 %
Single Plane A4C EF: 58.1 %
Weight: 2720 oz

## 2020-08-08 LAB — LIPID PANEL
Cholesterol: 183 mg/dL (ref 0–200)
HDL: 36 mg/dL — ABNORMAL LOW (ref >40)
LDL Cholesterol: 121 mg/dL — ABNORMAL HIGH (ref 0–99)
Total CHOL/HDL Ratio: 5.1 RATIO
Triglycerides: 128 mg/dL (ref <150)
VLDL: 26 mg/dL (ref 0–40)

## 2020-08-08 LAB — HEMOGLOBIN A1C
Hgb A1c MFr Bld: 6.2 % — ABNORMAL HIGH (ref 4.8–5.6)
Mean Plasma Glucose: 131.24 mg/dL

## 2020-08-08 LAB — RPR: RPR Ser Ql: NONREACTIVE

## 2020-08-08 LAB — TROPONIN I (HIGH SENSITIVITY): Troponin I (High Sensitivity): 4 ng/L (ref <18)

## 2020-08-08 MED ORDER — INFLUENZA VAC SPLIT QUAD 0.5 ML IM SUSY
0.5000 mL | PREFILLED_SYRINGE | INTRAMUSCULAR | Status: AC | PRN
  Administered 2020-08-09: 0.5 mL via INTRAMUSCULAR
  Filled 2020-08-08: qty 0.5

## 2020-08-08 MED ORDER — CLOPIDOGREL BISULFATE 75 MG PO TABS
75.0000 mg | ORAL_TABLET | Freq: Every day | ORAL | Status: DC
  Administered 2020-08-08 – 2020-08-09 (×2): 75 mg via ORAL
  Filled 2020-08-08 (×2): qty 1

## 2020-08-08 MED ORDER — POTASSIUM CHLORIDE CRYS ER 20 MEQ PO TBCR
40.0000 meq | EXTENDED_RELEASE_TABLET | Freq: Once | ORAL | Status: AC
  Administered 2020-08-08: 40 meq via ORAL
  Filled 2020-08-08: qty 2

## 2020-08-08 MED ORDER — ASPIRIN EC 325 MG PO TBEC: 325.0000 mg | DELAYED_RELEASE_TABLET | Freq: Every day | ORAL | Status: DC

## 2020-08-08 MED ORDER — ASPIRIN EC 81 MG PO TBEC
81.0000 mg | DELAYED_RELEASE_TABLET | Freq: Every day | ORAL | Status: DC
  Administered 2020-08-09: 81 mg via ORAL
  Filled 2020-08-08: qty 1

## 2020-08-08 NOTE — Progress Notes (Signed)
STROKE TEAM PROGRESS NOTE   INTERVAL HISTORY No family is at the bedside.  Pt lying in bed, no distress. AAO x 3, still has diplopia but with occluder on the glasses, he felt better. He still has eyes disconjugate with left eye upper gaze mildly impaired.  Vitals:   08/08/20 0400 08/08/20 0445 08/08/20 0600 08/08/20 0942  BP:  (!) 146/86 121/88 (!) 146/108  Pulse: 72 79 74 75  Resp: 17 20 17 17   Temp:      TempSrc:      SpO2: 94% 96% 95% 100%  Weight:      Height:       CBC:  Recent Labs  Lab 08/07/20 1224 08/07/20 1233  WBC 4.6  --   NEUTROABS 2.2  --   HGB 13.4 14.6  HCT 41.9 43.0  MCV 73.5*  --   PLT 217  --    Basic Metabolic Panel:  Recent Labs  Lab 08/07/20 1224 08/07/20 1224 08/07/20 1233 08/07/20 1706  NA 133*  --  139  --   K 3.4*  --  3.5  --   CL 97*  --  100  --   CO2 22  --   --   --   GLUCOSE 131*  --  134*  --   BUN 18  --  19  --   CREATININE 1.10   < > 1.20 1.04  CALCIUM 9.1  --   --   --    < > = values in this interval not displayed.   Lipid Panel:  Recent Labs  Lab 08/08/20 0331  CHOL 183  TRIG 128  HDL 36*  CHOLHDL 5.1  VLDL 26  LDLCALC 13/01/21*   HgbA1c:  Recent Labs  Lab 08/08/20 0331  HGBA1C 6.2*   Urine Drug Screen:  Recent Labs  Lab 08/07/20 1600  LABOPIA NONE DETECTED  COCAINSCRNUR NONE DETECTED  LABBENZ NONE DETECTED  AMPHETMU NONE DETECTED  THCU NONE DETECTED  LABBARB NONE DETECTED    Alcohol Level No results for input(s): ETH in the last 168 hours.  IMAGING past 24 hours CT HEAD WO CONTRAST  Result Date: 08/07/2020 CLINICAL DATA:  Double vision for 2 weeks. EXAM: CT HEAD WITHOUT CONTRAST TECHNIQUE: Contiguous axial images were obtained from the base of the skull through the vertex without intravenous contrast. COMPARISON:  None. FINDINGS: Brain: The ventricles are normal in size and configuration. No extra-axial fluid collections are identified. The gray-white differentiation is maintained. No CT findings for  acute hemispheric infarction or intracranial hemorrhage. No mass lesions. The brainstem and cerebellum are normal. Vascular: Scattered vascular calcifications. No hyperdense vessels or obvious aneurysm. Skull: No acute skull fracture.  No bone lesion. Sinuses/Orbits: Scattered mucoperiosteal thickening involving the ethmoid and maxillary sinuses. The mastoid air cells and middle ear cavities are clear. The globes are intact. Other: No scalp lesions, laceration or hematoma. IMPRESSION: 1. No acute intracranial findings or mass lesions. 2. Ethmoid and maxillary sinus disease. Electronically Signed   By: 08/09/2020 M.D.   On: 08/07/2020 13:32   MR ANGIO HEAD WO CONTRAST  Result Date: 08/07/2020 CLINICAL DATA:  Transit ischemic attack. EXAM: MRA HEAD WITHOUT CONTRAST TECHNIQUE: Angiographic images of the Circle of Willis were obtained using MRA technique without intravenous contrast. COMPARISON:  08/07/2020 CT and MRI head. FINDINGS: Anterior circulation: Patent ICAs. Mild distal left cavernous segment narrowing with mild pre stenotic dilatation (2:65). Patent ACAs. Patent MCAs. No large vessel occlusion, aneurysm, or vascular malformation.  Posterior circulation: Dominant right vertebral artery. Patent V4 segments and PICA. Patent basilar and superior cerebellar arteries. Bilateral PCAs are patent. Mild right P2 segment narrowing. No significant stenosis, proximal occlusion, aneurysm, or vascular malformation. Venous sinuses: No evidence of thrombosis. Anatomic variants: None of significance. IMPRESSION: No large vessel occlusion, dissection or aneurysm. Mild distal left cavernous ICA narrowing with mild pre stenotic dilatation. Mild right P2 segment narrowing. Electronically Signed   By: Stana Bunting M.D.   On: 08/07/2020 17:44   MR BRAIN WO CONTRAST  Result Date: 08/07/2020 CLINICAL DATA:  Diplopia EXAM: MRI HEAD WITHOUT CONTRAST TECHNIQUE: Multiplanar, multiecho pulse sequences of the brain and  surrounding structures were obtained without intravenous contrast. COMPARISON:  08/07/2020 head CT. FINDINGS: Brain: 2-3 mm focal restricted diffusion involving the medial right mid brain. Remote microhemorrhages involving the right frontal white matter and bilateral thalami. Remote lacunar insults involving the bilateral basal ganglia. Remote pontine insults. Mild cerebral atrophy with ex vacuo dilatation mild chronic microvascular ischemic changes. No midline shift, ventriculomegaly or extra-axial fluid collection. No mass lesion. Vascular: Mid right M1 segment T2 hyperintense signal may reflect slow flow versus high-grade narrowing. Remaining major intracranial flow voids are proximally preserved. Skull and upper cervical spine: Normal marrow signal. Sinuses/Orbits: Normal orbits. Mild pansinus mucosal thickening. Pneumatized mastoid air cells. Other: None. IMPRESSION: 3 mm medial right mid brain acute infarct. Remote lacunar insults involving the bilateral basal ganglia and pons. Remote right frontal white matter and thalamic microhemorrhages. Mild cerebral atrophy and chronic microvascular ischemic changes. Mid right M1 segment T2 hyperintensity, slow flow versus high-grade narrowing. These results were called by telephone at the time of interpretation on 08/07/2020 at 2:33 pm to provider [facility], who verbally acknowledged these results. Electronically Signed   By: Stana Bunting M.D.   On: 08/07/2020 14:38    PHYSICAL EXAM  Temp:  [98.3 F (36.8 C)] 98.3 F (36.8 C) (10/31 1759) Pulse Rate:  [62-94] 75 (11/01 0942) Resp:  [15-22] 17 (11/01 0942) BP: (121-181)/(83-116) 146/108 (11/01 0942) SpO2:  [93 %-100 %] 100 % (11/01 0942) Weight:  [77.1 kg] 77.1 kg (10/31 1158)  General - Well nourished, well developed, in no apparent distress.  Ophthalmologic - fundi not visualized due to noncooperation.  Cardiovascular - Regular rhythm and rate.  Mental Status -  Level of arousal and  orientation to time, place, and person were intact. Language including expression, naming, repetition, comprehension was assessed and found intact. Fund of Knowledge was assessed and was intact.  Cranial Nerves II - XII - II - Visual field intact OU. III, IV, VI - Extraocular movements intact except left eye upper gaze incomplete V - Facial sensation intact bilaterally. VII - Facial movement intact bilaterally. VIII - Hearing & vestibular intact bilaterally. X - Palate elevates symmetrically. XI - Chin turning & shoulder shrug intact bilaterally. XII - Tongue protrusion intact.  Motor Strength - The patient's strength was normal in all extremities and pronator drift was absent.  Bulk was normal and fasciculations were absent.   Motor Tone - Muscle tone was assessed at the neck and appendages and was normal.  Reflexes - The patient's reflexes were symmetrical in all extremities and he had no pathological reflexes.  Sensory - Light touch, temperature/pinprick were assessed and were symmetrical.    Coordination - The patient had normal movements in the hands with no ataxia or dysmetria.  Tremor was absent.  Gait and Station - deferred.   ASSESSMENT/PLAN Mr. Ruffus Kamaka is a 64  y.o. male with history of  HTN, HLD and stroke presenting with double vision x 2 weeks.   Stroke:  R midbrain infarct secondary to small vessel disease source  CT head No acute abnormality. Sinus dz  MRI  R midbrain infarct. Old B basal ganglia and pontine lacunes. Old R frontal lobe white matter and thalamic hemorrhages.   MRA  No LVO. Mild distal L cavernous ICA narrowing w/ prestenotic dilitation  Carotid Doppler  B ICA 1-39% stenosis, VAs antegrade   2D Echo EF 60-65%  LDL 121  HgbA1c 6.2  VTE prophylaxis - Heparin 5000 units sq tid   aspirin 81 mg daily prior to admission, now on aspirin 81 mg daily and clopidogrel 75 mg daily. Continue DAPT x 3 weeks then plavix alone.    Therapy  recommendations:  HH PT, HH OT  Disposition:  Return home  Hypertension  Stable on the high end . Long-term BP goal normotensive . Management per primary team  Hyperlipidemia  Home meds:  No statin  Now on lipitor 40   LDL 121, goal < 70  Continue statin at discharge  Other Stroke Risk Factors  ETOH use, advised to drink no more than 2 drink(s) a day  Hx stroke/TIA - details not available in Epic, MRI showed Old B basal ganglia and pontine lacunes. Old R frontal lobe white matter and thalamic hemorrhages.  Other Active Problems  Nonspecific EKG changes  Hospital day # 0  Neurology will sign off. Please call with questions. Pt will follow up with stroke clinic NP at [facility] in about 4 weeks. Thanks for the consult.  Marvel Plan, MD PhD Stroke Neurology 08/08/2020 5:26 PM    To contact Stroke Continuity provider, please refer to [email or web address]. After hours, contact General Neurology

## 2020-08-08 NOTE — Progress Notes (Signed)
  Echocardiogram 2D Echocardiogram has been performed.  Richard Myers 08/08/2020, 10:19 AM

## 2020-08-08 NOTE — ED Notes (Signed)
Lunch Tray Ordered @ 1017. 

## 2020-08-08 NOTE — Progress Notes (Signed)
08/08/20 1030  PT Visit Information  Last PT Received On 08/08/20  Assistance Needed +1  History of Present Illness  64 y.o. male with past medical history of HTN, HLD and stroke in past who presents to the ED for double vision which has been occurring off and on for the last 2 weeks. MRI head revealed acute right mid brain infarct.   Precautions  Precautions Fall  Restrictions  Weight Bearing Restrictions No  Home Living  Family/patient expects to be discharged to: Private residence  Living Arrangements Alone  Available Help at Discharge Family;Available PRN/intermittently (daughter)  Type of Home Apartment  Home Access Level entry  Home Layout One level  Bathroom Shower/Tub Tub/shower unit  Dealer - single point  Prior Function  Level of Independence Independent  Communication  Communication No difficulties  Pain Assessment  Pain Assessment No/denies pain  Cognition  Arousal/Alertness Awake/alert  Behavior During Therapy WFL for tasks assessed/performed  Overall Cognitive Status Within Functional Limits for tasks assessed  Upper Extremity Assessment  Upper Extremity Assessment Defer to OT evaluation  Lower Extremity Assessment  Lower Extremity Assessment Overall WFL for tasks assessed  Cervical / Trunk Assessment  Cervical / Trunk Assessment Normal  Bed Mobility  Overal bed mobility Modified Independent  Transfers  Overall transfer level Needs assistance  Equipment used None  Transfers Sit to/from Stand  Sit to Stand Supervision  General transfer comment Supervision for safety.   Ambulation/Gait  Ambulation/Gait assistance Supervision;Min guard  Gait Distance (Feet) 130 Feet  Assistive device None  Gait Pattern/deviations Step-through pattern;Decreased stride length  General Gait Details Worked on compensation strategies during gait for double vision. Pt wearing occluded glassess that OT made and pt reports that helped. Mild  LOB ntoed when turning around. Discussed importance of taking slow turns and fixating on an object when turning.   Gait velocity Decreased  Modified Rankin (Stroke Patients Only)  Pre-Morbid Rankin Score 0  Modified Rankin 4  Balance  Overall balance assessment Needs assistance  Sitting-balance support No upper extremity supported  Sitting balance-Leahy Scale Good  Standing balance support No upper extremity supported  Standing balance-Leahy Scale Fair  Standing balance comment balance impacted by decreased depth perception  PT - End of Session  Equipment Utilized During Treatment Gait belt  Activity Tolerance Patient tolerated treatment well  Patient left in bed;with call bell/phone within reach (on stretcher in ED )  Nurse Communication Mobility status  PT Assessment  PT Recommendation/Assessment Patient needs continued PT services  PT Visit Diagnosis Unsteadiness on feet (R26.81);Other symptoms and signs involving the nervous system (R29.898)  PT Problem List Decreased balance;Decreased mobility;Decreased knowledge of precautions  PT Plan  PT Frequency (ACUTE ONLY) Min 4X/week  PT Treatment/Interventions (ACUTE ONLY) Gait training;Therapeutic activities;Functional mobility training;Balance training;Therapeutic exercise;Patient/family education  AM-PAC PT "6 Clicks" Mobility Outcome Measure (Version 2)  Help needed turning from your back to your side while in a flat bed without using bedrails? 4  Help needed moving from lying on your back to sitting on the side of a flat bed without using bedrails? 4  Help needed moving to and from a bed to a chair (including a wheelchair)? 3  Help needed standing up from a chair using your arms (e.g., wheelchair or bedside chair)? 3  Help needed to walk in hospital room? 3  Help needed climbing 3-5 steps with a railing?  3  6 Click Score 20  Consider Recommendation of Discharge To: Home  with no services  PT Recommendation  Follow Up  Recommendations Home health PT;Supervision - Intermittent  PT equipment None recommended by PT  Individuals Consulted  Consulted and Agree with Results and Recommendations Patient  Acute Rehab PT Goals  Patient Stated Goal for his vision to imporve so he can drive and cook again  PT Goal Formulation With patient  Time For Goal Achievement 08/22/20  Potential to Achieve Goals Good  PT Time Calculation  PT Start Time (ACUTE ONLY) 1028  PT Stop Time (ACUTE ONLY) 1040  PT Time Calculation (min) (ACUTE ONLY) 12 min  PT General Charges  $$ ACUTE PT VISIT 1 Visit  PT Evaluation  $PT Eval Low Complexity 1 Low  Written Expression  Dominant Hand Right   Pt admitted secondary to problem above with deficits above. Pt requiring supervision to min guard for ambulation. Noted instability when turning, so educated about compensation strategies. Feel pt would benefit from HHPT to ensure safety at home. Reports daughter is only able to help intermittently. Will continue to follow acutely.   Richard Myers, PT, DPT  Acute Rehabilitation Services  Pager: [phone] Office: [phone]

## 2020-08-08 NOTE — Progress Notes (Signed)
Occupational Therapy Evaluation Patient Details Name: Richard Myers MRN: [id number] DOB: 21-Nov-1955 Today's Date: 08/08/2020    History of Present Illness  64 y.o. male with past medical history of HTN, HLD and stroke in past who presents to the ED for double vision which has been occurring off and on for the last 2 weeks. MRI head revealed acute right mid brain infarct.    Clinical Impression   PTA, pt lives alone and is independent, drives and manages his IADL tasks. Pt with complaints of double vision - appears to be describing horizontal diplopia. Pt with dysconjugate eye movement. Eye patch removed and partial occlusion glasses trialed, occluding nasal portion of non-dominant L eye. Pt reports improved vision with use of partial occlusion. Readers also partially occluded. Font and brightness level adjusted on phone to help with phone usage. Began education regarding management of partial occlusion and need to establish eye care with eye doctor after DC. Pt would strongly benefit from follow up with HHOT to help pt function with visual deficit in home to increase indepedenne with IADL tasks and reduce risk of falls. CM notified of recommendations. [facility] may be a good resource for pt as well Richard Myers [place] contact [phone]).  Do not remove tape from nasal portion of lens. Tape will be modified and reduced toward nasal field as pt able to tolerate. Will follow acutely.     Follow Up Recommendations  Home health OT;Supervision - Intermittent    Equipment Recommendations  Tub/shower seat    Recommendations for Other Services Other (comment) (CM consult)     Precautions / Restrictions Precautions Precautions: Fall      Mobility Bed Mobility Overal bed mobility: Modified Independent                  Transfers Overall transfer level: Needs assistance   Transfers: Sit to/from Stand Sit to Stand: Supervision               Balance Overall balance assessment: Needs assistance   Sitting balance-Leahy Scale: Good       Standing balance-Leahy Scale: Fair Standing balance comment: impacted by decreased depth perception                           ADL either performed or assessed with clinical judgement   ADL Overall ADL's : Needs assistance/impaired Eating/Feeding: Set up Eating/Feeding Details (indicate cue type and reason): Pt with increased amount of spillage due to deficits with depth perception Grooming: Set up;Standing   Upper Body Bathing: Set up;Sitting   Lower Body Bathing: Supervison/ safety;Set up;Sit to/from stand   Upper Body Dressing : Set up;Sitting   Lower Body Dressing: Set up;Supervision/safety;Sit to/from stand   Toilet Transfer: Supervision/safety;Ambulation   Toileting- Clothing Manipulation and Hygiene: Modified independent       Functional mobility during ADLs: Supervision/safety;Cueing for safety General ADL Comments: Pt states glasses help with double vision and his ability do "move better"     Vision Baseline Vision/History: Wears glasses Wears Glasses: Reading only Patient Visual Report: Blurring of vision;Diplopia Vision Assessment?: Yes Eye Alignment: Within Functional Limits Ocular Range of Motion: Impaired-to be further tested in functional context;Restricted on the right Alignment/Gaze Preference: Head tilt Tracking/Visual Pursuits: Impaired - to be further tested in functional context;Requires cues, head turns, or add eye shifts to track Saccades: Additional head turns occurred during testing;Decreased speed of saccadic movement;Impaired - to be further tested  in functional context Convergence: Impaired (comment) Visual Fields: No apparent deficits Diplopia Assessment: Objects split side to side;Present all the time/all directions Depth Perception: Undershoots     Perception Perception Comments: appeasr intact   Praxis Praxis Praxis  tested?: Within functional limits    Pertinent Vitals/Pain Pain Assessment: No/denies pain     Hand Dominance Right   Extremity/Trunk Assessment Upper Extremity Assessment Upper Extremity Assessment: Overall WFL for tasks assessed   Lower Extremity Assessment Lower Extremity Assessment: Overall WFL for tasks assessed   Cervical / Trunk Assessment Cervical / Trunk Assessment: Normal   Communication Communication Communication: No difficulties   Cognition Arousal/Alertness: Awake/alert Behavior During Therapy: WFL for tasks assessed/performed Overall Cognitive Status: Within Functional Limits for tasks assessed                                 General Comments: Concerns regarding insught/judgemetn. Pt experienced double vision on/off for 2 weeks before seeking medical attention. Pt states he "thought it was my high blood pressure"   General Comments   Partial occlusion glasses used to improve functional vision. Pt appears R eye dominant. L nasal field occluded with immediate improvement in decreased complaints of double vision. Began educating pt on purpose of glasses and technique of reducing width of tape gradually toward nose. Will benefit from further education. Pt's font size and level of brightness adjusted on his cell phone.     Exercises     Shoulder Instructions      Home Living Family/patient expects to be discharged to:: Private residence Living Arrangements: Alone Available Help at Discharge: Family;Available PRN/intermittently (daughter`) Type of Home: Apartment Home Access: Level entry     Home Layout: One level     Bathroom Shower/Tub: Chief Strategy Officer: Standard Bathroom Accessibility: Yes How Accessible: Accessible via walker Home Equipment: Cane - single point          Prior Functioning/Environment Level of Independence: Independent        Comments: drives; finances/medication management        OT Problem  List: Impaired vision/perception;Impaired balance (sitting and/or standing)      OT Treatment/Interventions: Self-care/ADL training;Therapeutic exercise;DME and/or AE instruction;Therapeutic activities;Visual/perceptual remediation/compensation;Patient/family education;Balance training    OT Goals(Current goals can be found in the care plan section) Acute Rehab OT Goals Patient Stated Goal: for his vision to imporve so he can drive and cook again OT Goal Formulation: With patient Time For Goal Achievement: 08/22/20 Potential to Achieve Goals: Good  OT Frequency: Min 3X/week   Barriers to D/C:            Co-evaluation              AM-PAC OT "6 Clicks" Daily Activity     Outcome Measure Help from another person eating meals?: A Little Help from another person taking care of personal grooming?: A Little Help from another person toileting, which includes using toliet, bedpan, or urinal?: A Little Help from another person bathing (including washing, rinsing, drying)?: A Little Help from another person to put on and taking off regular upper body clothing?: A Little Help from another person to put on and taking off regular lower body clothing?: A Little 6 Click Score: 18   End of Session Nurse Communication: Mobility status;Other (comment) (use of partial occlusion)  Activity Tolerance: Patient tolerated treatment well Patient left: in bed;with call bell/phone within reach  OT Visit Diagnosis: Unsteadiness  on feet (R26.81);Other (comment) (diplopia)                Time: 4010-2725 OT Time Calculation (min): 40 min Charges:  OT Evaluation $OT Eval Moderate Complexity: 1 Mod  Matthieu Loftus, OT/L   Acute OT Clinical Specialist Acute Rehabilitation Services Pager [phone] Office [phone]   [facility] 08/08/2020, 9:05 AM

## 2020-08-08 NOTE — Progress Notes (Signed)
Received on cart from ED.  Oriented to room and surroundings.  Provided stroke education.

## 2020-08-08 NOTE — Progress Notes (Signed)
PROGRESS NOTE                                                                                                                                                                                                             Patient Demographics:    Richard Myers, is a 64 y.o. male, DOB - 1955-11-22, ZOX:[id number]  Admit date - 08/07/2020   Admitting Physician Gertha Calkin, MD  Outpatient Primary MD for the patient is Patient, No Pcp Per  LOS - 0  Chief Complaint  Patient presents with   Diplopia       Brief Narrative (HPI from H&P) - Richard Myers is a 64 y.o. male with medical history significant of HTN and cva in past comes for double vision.He reports that his diplopia today started today am and he did have an episode of it a week ago when he was washing dishes where he has completely blurred vision when he took his bp med and resolved. He continued to have off-and-on double vision presented to the ER where he was diagnosed with CVA.   Subjective:    Richard Myers today has, No headache, No chest pain, No abdominal pain - No Nausea, No new weakness tingling or numbness, No Cough - SOB. Still has diplopia.   Assessment  & Plan :    Principal Problem:   Binocular vision disorder with diplopia Active Problems:   Essential hypertension   Acute CVA (cerebrovascular accident) (HCC)   Dyslipidemia   CVA (cerebral vascular accident) (HCC)   1. Diplopia. Caused by 3 mm medial right brain acute infarct along with some remote lacunar infarcts, full stroke protocol underway and stroke team is following, currently on dual antiplatelet therapy along with statin, will complete stroke work-up, duration of antiplatelet therapy and full medications to be outlined by the stroke team.   Lab Results  Component Value Date   HGBA1C 6.2 (H) 08/08/2020   Lipid Panel     Component Value Date/Time   CHOL 183 08/08/2020 0331   TRIG 128 08/08/2020 0331   HDL 36 (L) 08/08/2020  0331   CHOLHDL 5.1 08/08/2020 0331   VLDL 26 08/08/2020 0331   LDLCALC 121 (H) 08/08/2020 0331     2. Essential hypertension. Allow for permissive hypertension for now.  3. Dyslipidemia. On high intensity statin. LDL was above goal.  4. Non Specific EKG changes. No chest pain. Review echo, currently antiplatelet and statin therapy, will add blood pressure medications prior to discharge.  Condition - Extremely Guarded  Family Communication  :    Daughter Reather Converse [phone] on 08/08/20  Code Status :  Full  Consults  :  Neuro  Procedures  :   TEE -   Carotid - Right Carotid: Velocities in the right ICA are consistent with a 1-39% stenosis.                The ECA appears <50% stenosed. Left Carotid: Velocities in the left ICA are consistent with a 1-39% stenosis.               The ECA appears <50% stenosed. Vertebrals: Bilateral vertebral arteries demonstrate antegrade flow.   CT - MRI - MRA -  3 mm medial right mid brain acute infarct. Remote lacunar insults involving the bilateral basal ganglia and pons. Remote right frontal white matter and thalamic microhemorrhages. Mild cerebral atrophy and chronic microvascular ischemic changes. Mid right M1 segment T2 hyperintensity, slow flow versus high-grade narrowing.  PUD Prophylaxis :   Disposition Plan  :    Status is: Observation     Dispo: The patient is from: Home              Anticipated d/c is to: Home              Anticipated d/c date is: 2 days              Patient currently is not medically stable to d/c.   DVT Prophylaxis  :  Heparin    Lab Results  Component Value Date   PLT 217 08/07/2020    Diet :  Diet Order            Diet 2 gram sodium Room service appropriate? Yes; Fluid consistency: Thin  Diet effective now                  Inpatient Medications Scheduled Meds:  [START ON 08/09/2020] aspirin EC  325 mg Oral Daily   atorvastatin  40 mg Oral Daily   clopidogrel  75 mg Oral Daily    heparin  5,000 Units Subcutaneous Q8H   pantoprazole (PROTONIX) IV  40 mg Intravenous Q24H   thiamine injection  100 mg Intravenous Daily   Continuous Infusions:  sodium chloride 10 mL/hr at 08/07/20 2223   PRN Meds:.acetaminophen **OR** [DISCONTINUED] acetaminophen (TYLENOL) oral liquid 160 mg/5 mL **OR** [DISCONTINUED] acetaminophen  Antibiotics  :   Anti-infectives (From admission, onward)   None          Objective:   Vitals:   08/08/20 0400 08/08/20 0445 08/08/20 0600 08/08/20 0942  BP:  (!) 146/86 121/88 (!) 146/108  Pulse: 72 79 74 75  Resp: Temp:      TempSrc:      SpO2: 94% 96% 95% 100%  Weight:      Height:        SpO2: 100 %  Wt Readings from Last 3 Encounters:  08/07/20 77.1 kg  01/04/12 68 kg    No intake or output data in the 24 hours ending 08/08/20 1159   Physical Exam  Awake Alert, diplopia  [place].AT,PERRAL Supple Neck,No JVD, No cervical lymphadenopathy appriciated.  Symmetrical Chest wall movement, Good air movement bilaterally, CTAB RRR,No Gallops,Rubs or new Murmurs, No Parasternal Heave +ve B.Sounds, Abd Soft, No tenderness, No organomegaly appriciated, No rebound - guarding or rigidity. No Cyanosis, Clubbing or edema, No new Rash or bruise     Data Review:  Recent Labs  Lab 08/07/20 1224 08/07/20 1233  WBC 4.6  --   HGB 13.4 14.6  HCT 41.9 43.0  PLT 217  --   MCV 73.5*  --   MCH 23.5*  --   MCHC 32.0  --   RDW 15.9*  --   LYMPHSABS 1.8  --   MONOABS 0.3  --   EOSABS 0.2  --   BASOSABS 0.1  --     Recent Labs  Lab 08/07/20 1224 08/07/20 1233 08/07/20 1706 08/08/20 0331  NA 133* 139  --   --   K 3.4* 3.5  --   --   CL 97* 100  --   --   CO2 22  --   --   --   GLUCOSE 131* 134*  --   --   BUN 18 19  --   --   CREATININE 1.10 1.20 1.04  --   CALCIUM 9.1  --   --   --   AST 21  --   --   --   ALT 15  --   --   --   ALKPHOS 68  --   --   --   BILITOT 0.6  --   --   --   ALBUMIN 3.8  --   --   --     INR 1.0  --   --   --   HGBA1C  --   --   --  6.2*    Recent Labs  Lab 08/07/20 1502  SARSCOV2NAA NEGATIVE    ------------------------------------------------------------------------------------------------------------------ Recent Labs    08/08/20 0331  CHOL 183  HDL 36*  LDLCALC 121*  TRIG 128  CHOLHDL 5.1    Lab Results  Component Value Date   HGBA1C 6.2 (H) 08/08/2020   ------------------------------------------------------------------------------------------------------------------ No results for input(s): TSH, T4TOTAL, T3FREE, THYROIDAB in the last 72 hours.  Invalid input(s): FREET3 ------------------------------------------------------------------------------------------------------------------ Recent Labs    08/07/20 1706  VITAMINB12 606    Coagulation profile Recent Labs  Lab 08/07/20 1224  INR 1.0    No results for input(s): DDIMER in the last 72 hours.  Cardiac Enzymes No results for input(s): CKMB, TROPONINI, MYOGLOBIN in the last 168 hours.  Invalid input(s): CK ------------------------------------------------------------------------------------------------------------------ No results found for: BNP  Micro Results Recent Results (from the past 240 hour(s))  Respiratory Panel by RT PCR (Flu A&B, Covid) - Nasopharyngeal Swab     Status: None   Collection Time: 08/07/20  3:02 PM   Specimen: Nasopharyngeal Swab  Result Value Ref Range Status   SARS Coronavirus 2 by RT PCR NEGATIVE NEGATIVE Final    Comment: (NOTE) SARS-CoV-2 target nucleic acids are NOT DETECTED.  The SARS-CoV-2 RNA is generally detectable in upper respiratoy specimens during the acute phase of infection. The lowest concentration of SARS-CoV-2 viral copies this assay can detect is 131 copies/mL. A negative result does not preclude SARS-Cov-2 infection and should not be used as the sole basis for treatment or other patient management decisions. A negative result may occur  with  improper specimen collection/handling, submission of specimen other than nasopharyngeal swab, presence of viral mutation(s) within the areas targeted by this assay, and inadequate number of viral copies (<131 copies/mL). A negative result must be combined with clinical observations, patient history, and epidemiological information. The expected result is Negative.  Fact Sheet for Patients:  https://www.moore.com/  Fact Sheet for Healthcare Providers:  https://www.young.biz/  This test is no t yet approved or  cleared by the [place] and  has been authorized for detection and/or diagnosis of SARS-CoV-2 by FDA under an Emergency Use Authorization (EUA). This EUA will remain  in effect (meaning this test can be used) for the duration of the COVID-19 declaration under Section 564(b)(1) of the Act, 21 U.S.C. section 360bbb-3(b)(1), unless the authorization is terminated or revoked sooner.     Influenza A by PCR NEGATIVE NEGATIVE Final   Influenza B by PCR NEGATIVE NEGATIVE Final    Comment: (NOTE) The Xpert Xpress SARS-CoV-2/FLU/RSV assay is intended as an aid in  the diagnosis of influenza from Nasopharyngeal swab specimens and  should not be used as a sole basis for treatment. Nasal washings and  aspirates are unacceptable for Xpert Xpress SARS-CoV-2/FLU/RSV  testing.  Fact Sheet for Patients: https://www.moore.com/  Fact Sheet for Healthcare Providers: https://www.young.biz/  This test is not yet approved or cleared by the [place] FDA and  has been authorized for detection and/or diagnosis of SARS-CoV-2 by  FDA under an Emergency Use Authorization (EUA). This EUA will remain  in effect (meaning this test can be used) for the duration of the  Covid-19 declaration under Section 564(b)(1) of the Act, 21  U.S.C. section 360bbb-3(b)(1), unless the authorization is  terminated or  revoked. Performed at [facility], [address] [place], [place] [address]     Radiology Reports CT HEAD WO CONTRAST  Result Date: 08/07/2020 CLINICAL DATA:  Double vision for 2 weeks. EXAM: CT HEAD WITHOUT CONTRAST TECHNIQUE: Contiguous axial images were obtained from the base of the skull through the vertex without intravenous contrast. COMPARISON:  None. FINDINGS: Brain: The ventricles are normal in size and configuration. No extra-axial fluid collections are identified. The gray-white differentiation is maintained. No CT findings for acute hemispheric infarction or intracranial hemorrhage. No mass lesions. The brainstem and cerebellum are normal. Vascular: Scattered vascular calcifications. No hyperdense vessels or obvious aneurysm. Skull: No acute skull fracture.  No bone lesion. Sinuses/Orbits: Scattered mucoperiosteal thickening involving the ethmoid and maxillary sinuses. The mastoid air cells and middle ear cavities are clear. The globes are intact. Other: No scalp lesions, laceration or hematoma. IMPRESSION: 1. No acute intracranial findings or mass lesions. 2. Ethmoid and maxillary sinus disease. Electronically Signed   By: Rudie Meyer M.D.   On: 08/07/2020 13:32   MR ANGIO HEAD WO CONTRAST  Result Date: 08/07/2020 CLINICAL DATA:  Transit ischemic attack. EXAM: MRA HEAD WITHOUT CONTRAST TECHNIQUE: Angiographic images of the Circle of Willis were obtained using MRA technique without intravenous contrast. COMPARISON:  08/07/2020 CT and MRI head. FINDINGS: Anterior circulation: Patent ICAs. Mild distal left cavernous segment narrowing with mild pre stenotic dilatation (2:65). Patent ACAs. Patent MCAs. No large vessel occlusion, aneurysm, or vascular malformation. Posterior circulation: Dominant right vertebral artery. Patent V4 segments and PICA. Patent basilar and superior cerebellar arteries. Bilateral PCAs are patent. Mild right P2 segment narrowing. No significant stenosis,  proximal occlusion, aneurysm, or vascular malformation. Venous sinuses: No evidence of thrombosis. Anatomic variants: None of significance. IMPRESSION: No large vessel occlusion, dissection or aneurysm. Mild distal left cavernous ICA narrowing with mild pre stenotic dilatation. Mild right P2 segment narrowing. Electronically Signed   By: Stana Bunting M.D.   On: 08/07/2020 17:44   MR BRAIN WO CONTRAST  Result Date: 08/07/2020 CLINICAL DATA:  Diplopia EXAM: MRI HEAD WITHOUT CONTRAST TECHNIQUE: Multiplanar, multiecho pulse sequences of the brain and surrounding structures were obtained without intravenous contrast. COMPARISON:  08/07/2020 head CT.  FINDINGS: Brain: 2-3 mm focal restricted diffusion involving the medial right mid brain. Remote microhemorrhages involving the right frontal white matter and bilateral thalami. Remote lacunar insults involving the bilateral basal ganglia. Remote pontine insults. Mild cerebral atrophy with ex vacuo dilatation mild chronic microvascular ischemic changes. No midline shift, ventriculomegaly or extra-axial fluid collection. No mass lesion. Vascular: Mid right M1 segment T2 hyperintense signal may reflect slow flow versus high-grade narrowing. Remaining major intracranial flow voids are proximally preserved. Skull and upper cervical spine: Normal marrow signal. Sinuses/Orbits: Normal orbits. Mild pansinus mucosal thickening. Pneumatized mastoid air cells. Other: None. IMPRESSION: 3 mm medial right mid brain acute infarct. Remote lacunar insults involving the bilateral basal ganglia and pons. Remote right frontal white matter and thalamic microhemorrhages. Mild cerebral atrophy and chronic microvascular ischemic changes. Mid right M1 segment T2 hyperintensity, slow flow versus high-grade narrowing. These results were called by telephone at the time of interpretation on 08/07/2020 at 2:33 pm to provider [facility], who verbally acknowledged these results.  Electronically Signed   By: Stana Bunting M.D.   On: 08/07/2020 14:38   VAS US CAROTID (at [facility] and [facility] only)  Result Date: 08/08/2020 Carotid Arterial Duplex Study Indications:  TIA and CVA. Risk Factors: Hypertension, hyperlipidemia. Performing Technologist: Jannet Askew  Examination Guidelines: A complete evaluation includes B-mode imaging, spectral Doppler, color Doppler, and power Doppler as needed of all accessible portions of each vessel. Bilateral testing is considered an integral part of a complete examination. Limited examinations for reoccurring indications may be performed as noted.  Right Carotid Findings: +----------+--------+--------+--------+------------------+------------------+             PSV cm/s EDV cm/s Stenosis Plaque Description Comments            +----------+--------+--------+--------+------------------+------------------+  CCA Prox   137      27                                                       +----------+--------+--------+--------+------------------+------------------+  CCA Distal 93       22                                                       +----------+--------+--------+--------+------------------+------------------+  ICA Prox   70       31       1-39%                       intimal thickening  +----------+--------+--------+--------+------------------+------------------+  ICA Distal 95       44                                                       +----------+--------+--------+--------+------------------+------------------+  ECA        62       12                                                       +----------+--------+--------+--------+------------------+------------------+ +----------+--------+-------+--------+-------------------+  PSV cm/s EDV cms Describe Arm Pressure (mmHG)  +----------+--------+-------+--------+-------------------+  Subclavian 117      2                                     +----------+--------+-------+--------+-------------------+  +---------+--------+--+--------+--+---------+  Vertebral PSV cm/s 52 EDV cm/s 16 Antegrade  +---------+--------+--+--------+--+---------+  Left Carotid Findings: +----------+--------+--------+--------+------------------+------------------+             PSV cm/s EDV cm/s Stenosis Plaque Description Comments            +----------+--------+--------+--------+------------------+------------------+  CCA Prox   135      22                                                       +----------+--------+--------+--------+------------------+------------------+  CCA Distal 90       25                                                       +----------+--------+--------+--------+------------------+------------------+  ICA Prox   69       23       1-39%                       intimal thickening  +----------+--------+--------+--------+------------------+------------------+  ICA Distal 85       34                                                       +----------+--------+--------+--------+------------------+------------------+  ECA        78       17                                                       +----------+--------+--------+--------+------------------+------------------+ +----------+--------+--------+--------+-------------------+             PSV cm/s EDV cm/s Describe Arm Pressure (mmHG)  +----------+--------+--------+--------+-------------------+  Subclavian 145      39                                     +----------+--------+--------+--------+-------------------+ +---------+--------+--+--------+--+---------+  Vertebral PSV cm/s 54 EDV cm/s 21 Antegrade  +---------+--------+--+--------+--+---------+   Summary: Right Carotid: Velocities in the right ICA are consistent with a 1-39% stenosis.                The ECA appears <50% stenosed. Left Carotid: Velocities in the left ICA are consistent with a 1-39% stenosis.               The ECA appears <50% stenosed. Vertebrals: Bilateral vertebral arteries demonstrate antegrade flow. *See  table(s) above for measurements and observations.     Preliminary     Time Spent in minutes  30   Susa Raring M.D on 08/08/2020 at 11:59 AM  To page go to [email or web address] - password [facility]

## 2020-08-08 NOTE — Progress Notes (Signed)
Carotid study completed.   See CVProc for preliminary results.   Lavern Maslow, RDMS, RVT 

## 2020-08-08 NOTE — CV Procedure (Signed)
2D echo attempted, but therapy is with patient. Will try later.

## 2020-08-09 LAB — CBC WITH DIFFERENTIAL/PLATELET
Abs Immature Granulocytes: 0 10*3/uL (ref 0.00–0.07)
Basophils Absolute: 0 10*3/uL (ref 0.0–0.1)
Basophils Relative: 1 %
Eosinophils Absolute: 0.2 10*3/uL (ref 0.0–0.5)
Eosinophils Relative: 5 %
HCT: 39.7 % (ref 39.0–52.0)
Hemoglobin: 12.8 g/dL — ABNORMAL LOW (ref 13.0–17.0)
Immature Granulocytes: 0 %
Lymphocytes Relative: 48 %
Lymphs Abs: 2.2 10*3/uL (ref 0.7–4.0)
MCH: 23.9 pg — ABNORMAL LOW (ref 26.0–34.0)
MCHC: 32.2 g/dL (ref 30.0–36.0)
MCV: 74.1 fL — ABNORMAL LOW (ref 80.0–100.0)
Monocytes Absolute: 0.4 10*3/uL (ref 0.1–1.0)
Monocytes Relative: 9 %
Neutro Abs: 1.7 10*3/uL (ref 1.7–7.7)
Neutrophils Relative %: 37 %
Platelets: 212 10*3/uL (ref 150–400)
RBC: 5.36 MIL/uL (ref 4.22–5.81)
RDW: 15.9 % — ABNORMAL HIGH (ref 11.5–15.5)
WBC: 4.5 10*3/uL (ref 4.0–10.5)
nRBC: 0 % (ref 0.0–0.2)

## 2020-08-09 LAB — COMPREHENSIVE METABOLIC PANEL
ALT: 13 U/L (ref 0–44)
AST: 15 U/L (ref 15–41)
Albumin: 3.2 g/dL — ABNORMAL LOW (ref 3.5–5.0)
Alkaline Phosphatase: 61 U/L (ref 38–126)
Anion gap: 9 (ref 5–15)
BUN: 13 mg/dL (ref 8–23)
CO2: 25 mmol/L (ref 22–32)
Calcium: 8.9 mg/dL (ref 8.9–10.3)
Chloride: 104 mmol/L (ref 98–111)
Creatinine, Ser: 1.09 mg/dL (ref 0.61–1.24)
GFR, Estimated: >60 mL/min (ref >60)
Glucose, Bld: 112 mg/dL — ABNORMAL HIGH (ref 70–99)
Potassium: 3.8 mmol/L (ref 3.5–5.1)
Sodium: 138 mmol/L (ref 135–145)
Total Bilirubin: 1 mg/dL (ref 0.3–1.2)
Total Protein: 6.8 g/dL (ref 6.5–8.1)

## 2020-08-09 LAB — MAGNESIUM: Magnesium: 2.1 mg/dL (ref 1.7–2.4)

## 2020-08-09 LAB — BRAIN NATRIURETIC PEPTIDE: B Natriuretic Peptide: 14.5 pg/mL (ref 0.0–100.0)

## 2020-08-09 LAB — ANA W/REFLEX IF POSITIVE: Anti Nuclear Antibody (ANA): NEGATIVE

## 2020-08-09 MED ORDER — CLOPIDOGREL BISULFATE 75 MG PO TABS
75.0000 mg | ORAL_TABLET | Freq: Every day | ORAL | 0 refills | Status: DC
Start: 2020-08-09 — End: 2020-09-08

## 2020-08-09 MED ORDER — ATORVASTATIN CALCIUM 40 MG PO TABS
40.0000 mg | ORAL_TABLET | Freq: Every day | ORAL | 0 refills | Status: DC
Start: 2020-08-09 — End: 2020-08-14

## 2020-08-09 MED ORDER — LISINOPRIL-HYDROCHLOROTHIAZIDE 10-12.5 MG PO TABS: 1.0000 | ORAL_TABLET | Freq: Every day | ORAL | 3 refills | Status: DC

## 2020-08-09 NOTE — TOC Transition Note (Signed)
Transition of Care [facility]) - CM/SW Discharge Note   Patient Details  Name: Richard Myers MRN: [id number] Date of Birth: 12-15-1955  [facility] CM/SW Contact:  Pollie Friar, RN Phone Number: 08/09/2020, 12:06 PM   Clinical Narrative:    Pt is discharging home with charity [facility] services through [facility]. Tommi Rumps with [facility] accepted the referral. Pt without a PCP or insurance. CM met with the patient and obtained him an appt at [facility] with information on the AVS. Pt also aware of [facility] and using its pharmacy for medication assistance. 3n 1 to be delivered to the room under charity services from [facility]. Pt states he has transport home.   Final next level of care: [place] Barriers to Discharge: Inadequate or no insurance, Barriers Unresolved (comment)   Patient Goals and CMS Choice   CMS Medicare.gov Compare Post Acute Care list provided to:: Patient Choice offered to / list presented to : Patient  Discharge Placement                       Discharge Plan and Services                DME Arranged: 3-N-1 DME Agency: [employer] Date DME Agency Contacted: 08/09/20   Representative spoke with at DME Agency: Peggye [employer]--charity [place] Arranged: PT, OT [place] Agency: [place] Date [place]: 08/09/20   Representative spoke with at [place]: Cory---Charity  Social Determinants of Health ([place]) Interventions     Readmission Risk Interventions No flowsheet data found.

## 2020-08-09 NOTE — Discharge Summary (Signed)
Richard Myers ZOX:[id number] DOB: November 11, 1955 DOA: 08/07/2020  PCP: Patient, No Pcp Per  Admit date: 08/07/2020  Discharge date: 08/09/2020  Admitted From: Home  Disposition:  Home   Recommendations for Outpatient Follow-up:   Follow up with PCP in 1-2 weeks  PCP Please obtain BMP/CBC, 2 view CXR in 1week,  (see Discharge instructions)   PCP Please follow up on the following pending results: Monitor blood pressure, LDL, A1c, needs outpatient neurology follow-up.  Outpatient cardiology follow-up in 3 to 4 weeks.   Home Health: PT, OT Equipment/Devices: None along with tub shower seat Consultations: Neurology Discharge Condition: Stable    CODE STATUS: Full    Diet Recommendation: Heart Healthy   Diet Order            Diet - low sodium heart healthy           Diet 2 gram sodium Room service appropriate? Yes; Fluid consistency: Thin  Diet effective now                  Chief Complaint  Patient presents with  . Diplopia     Brief history of present illness from the day of admission and additional interim summary    Richard Myers a 64 y.o.malewith medical history significant ofHTN and cva in past comes for double vision.He reports that his diplopia today started today am and he did have an episode of it a week ago when he was washing dishes where he has completely blurred vision when he took his bp med and resolved. He continued to have off-and-on double vision presented to the ER where he was diagnosed with CVA.                                                                 Hospital Course   1. Diplopia. Caused by 3 mm medial right brain acute infarct along with some remote lacunar infarcts- he was seen by the neurology team and the stroke team, full stroke work-up was done, he will be on  aspirin and Plavix for 3 months thereafter Plavix only, high intensity statin for the rest of his life, he will eyepatch alternately on each eye for 2 to 3 hours, requested not to drive till cleared by neurology, still has diplopia but no other deficits.  Allow permissive hypertension for another day thereafter can resume his blood pressure medications from tomorrow at home, will follow with PCP and neurology post discharge, will get a cane, home PT OT and a tub shower seat upon discharge as well.  Lab Results  Component Value Date   HGBA1C 6.2 (H) 08/08/2020   Lipid Panel     Component Value Date/Time   CHOL 183 08/08/2020 0331   TRIG 128 08/08/2020 0331   HDL 36 (L) 08/08/2020 0331  CHOLHDL 5.1 08/08/2020 0331   VLDL 26 08/08/2020 0331   LDLCALC 121 (H) 08/08/2020 0331      2. Essential hypertension. Allow for permissive hypertension for now.  Resume home medication tomorrow.  3. Dyslipidemia. On high intensity statin. LDL was above goal.  4. Non Specific EKG changes. No chest pain. Review echo, currently antiplatelet and statin therapy, will add blood pressure medications prior to discharge.  May benefit from one-time outpatient cardiology evaluation to be arranged by PCP in the next 3 to 4 weeks.  Echo was satisfactory with EF of 60% without any wall motion abnormality.    Discharge diagnosis     Principal Problem:   Binocular vision disorder with diplopia Active Problems:   Essential hypertension   Acute CVA (cerebrovascular accident) (HCC)   Dyslipidemia   CVA (cerebral vascular accident) Richard Myers)    Discharge instructions    Discharge Instructions    Ambulatory referral to Neurology   Complete by: As directed    Follow up with stroke clinic NP (Richard Myers or Richard Myers, if both not available, consider Richard Myers, or Richard Myers) at [facility] in about 4 weeks. Thanks.   Diet - low sodium heart healthy   Complete by: As directed    Discharge instructions    Complete by: As directed    Do not drive, operate heavy machinery till cleared by the neurologist.  You will take aspirin and Plavix for 3 months, after 3 months stop aspirin and continue taking Plavix only.  He will continue to take Lipitor for the rest of your life.   Follow with Primary MD  in 7 days   Get CBC, CMP, 2 view Chest X ray -  checked next visit within 1 week by Primary MD   Activity: As tolerated with Full fall precautions use walker/cane & assistance as needed  Disposition Home   Diet: Heart Healthy    Special Instructions: If you have smoked or chewed Tobacco  in the last 2 yrs please stop smoking, stop any regular Alcohol  and or any Recreational drug use.  On your next visit with your primary care physician please Get Medicines reviewed and adjusted.  Please request your Prim.MD to go over all Hospital Tests and Procedure/Radiological results at the follow up, please get all Hospital records sent to your Prim MD by signing hospital release before you go home.  If you experience worsening of your admission symptoms, develop shortness of breath, life threatening emergency, suicidal or homicidal thoughts you must seek medical attention immediately by calling 911 or calling your MD immediately  if symptoms less severe.  You Must read complete instructions/literature along with all the possible adverse reactions/side effects for all the Medicines you take and that have been prescribed to you. Take any new Medicines after you have completely understood and accpet all the possible adverse reactions/side effects.   Increase activity slowly   Complete by: As directed       Discharge Medications   Allergies as of 08/09/2020   No Known Allergies     Medication List    TAKE these medications   aspirin EC 81 MG tablet Take 81 mg by mouth daily.   atorvastatin 40 MG tablet Commonly known as: LIPITOR Take 1 tablet (40 mg total) by mouth daily.   clopidogrel 75 MG  tablet Commonly known as: PLAVIX Take 1 tablet (75 mg total) by mouth daily.   diclofenac 75 MG EC tablet Commonly known as: VOLTAREN Take 1 tablet (  75 mg total) by mouth 2 (two) times daily.   lisinopril-hydrochlorothiazide 10-12.5 MG tablet Commonly known as: Zestoretic Take 1 tablet by mouth daily. Start taking on: August 10, 2020   omeprazole 20 MG capsule Commonly known as: PRILOSEC Take 20 mg by mouth daily.            Durable Medical Equipment  (From admission, onward)         Start     Ordered   08/09/20 1012  For home use only DME Other see comment  Once       Comments: For CVA and diplopia, cane and tub shower seat.  Question:  Length of Need  Answer:  6 Months   08/09/20 1012           Follow-up Information    [facility]. Schedule an appointment as soon as possible for a visit in 1 week(s).   Why: they have primary care, pharmacy and finacial counselling please call onmonday to schedule a appointment Contact information: [address] [place] [address] [phone]       [facility]. Schedule an appointment as soon as possible for a visit in 4 week(s).   Specialty: Neurology Contact information: [address] [place] [address] [phone]              Major procedures and Radiology Reports - PLEASE review detailed and final reports thoroughly  -         CT HEAD WO CONTRAST  Result Date: 08/07/2020 CLINICAL DATA:  Double vision for 2 weeks. EXAM: CT HEAD WITHOUT CONTRAST TECHNIQUE: Contiguous axial images were obtained from the base of the skull through the vertex without intravenous contrast. COMPARISON:  None. FINDINGS: Brain: The ventricles are normal in size and configuration. No extra-axial fluid collections are identified. The gray-white differentiation is maintained. No CT findings for acute hemispheric infarction or intracranial  hemorrhage. No mass lesions. The brainstem and cerebellum are normal. Vascular: Scattered vascular calcifications. No hyperdense vessels or obvious aneurysm. Skull: No acute skull fracture.  No bone lesion. Sinuses/Orbits: Scattered mucoperiosteal thickening involving the ethmoid and maxillary sinuses. The mastoid air cells and middle ear cavities are clear. The globes are intact. Other: No scalp lesions, laceration or hematoma. IMPRESSION: 1. No acute intracranial findings or mass lesions. 2. Ethmoid and maxillary sinus disease. Electronically Signed   By: Rudie Meyer M.D.   On: 08/07/2020 13:32   MR ANGIO HEAD WO CONTRAST  Result Date: 08/07/2020 CLINICAL DATA:  Transit ischemic attack. EXAM: MRA HEAD WITHOUT CONTRAST TECHNIQUE: Angiographic images of the Circle of Willis were obtained using MRA technique without intravenous contrast. COMPARISON:  08/07/2020 CT and MRI head. FINDINGS: Anterior circulation: Patent ICAs. Mild distal left cavernous segment narrowing with mild pre stenotic dilatation (2:65). Patent ACAs. Patent MCAs. No large vessel occlusion, aneurysm, or vascular malformation. Posterior circulation: Dominant right vertebral artery. Patent V4 segments and PICA. Patent basilar and superior cerebellar arteries. Bilateral PCAs are patent. Mild right P2 segment narrowing. No significant stenosis, proximal occlusion, aneurysm, or vascular malformation. Venous sinuses: No evidence of thrombosis. Anatomic variants: None of significance. IMPRESSION: No large vessel occlusion, dissection or aneurysm. Mild distal left cavernous ICA narrowing with mild pre stenotic dilatation. Mild right P2 segment narrowing. Electronically Signed   By: Stana Bunting M.D.   On: 08/07/2020 17:44   MR BRAIN WO CONTRAST  Result Date: 08/07/2020 CLINICAL DATA:  Diplopia EXAM: MRI HEAD WITHOUT  CONTRAST TECHNIQUE: Multiplanar, multiecho pulse sequences of the brain and surrounding structures were obtained without  intravenous contrast. COMPARISON:  08/07/2020 head CT. FINDINGS: Brain: 2-3 mm focal restricted diffusion involving the medial right mid brain. Remote microhemorrhages involving the right frontal white matter and bilateral thalami. Remote lacunar insults involving the bilateral basal ganglia. Remote pontine insults. Mild cerebral atrophy with ex vacuo dilatation mild chronic microvascular ischemic changes. No midline shift, ventriculomegaly or extra-axial fluid collection. No mass lesion. Vascular: Mid right M1 segment T2 hyperintense signal may reflect slow flow versus high-grade narrowing. Remaining major intracranial flow voids are proximally preserved. Skull and upper cervical spine: Normal marrow signal. Sinuses/Orbits: Normal orbits. Mild pansinus mucosal thickening. Pneumatized mastoid air cells. Other: None. IMPRESSION: 3 mm medial right mid brain acute infarct. Remote lacunar insults involving the bilateral basal ganglia and pons. Remote right frontal white matter and thalamic microhemorrhages. Mild cerebral atrophy and chronic microvascular ischemic changes. Mid right M1 segment T2 hyperintensity, slow flow versus high-grade narrowing. These results were called by telephone at the time of interpretation on 08/07/2020 at 2:33 pm to provider [facility], who verbally acknowledged these results. Electronically Signed   By: Stana Bunting M.D.   On: 08/07/2020 14:38   ECHOCARDIOGRAM COMPLETE  Result Date: 08/08/2020    ECHOCARDIOGRAM REPORT   Patient Name:   TC Graffeo Date of Exam: 08/08/2020 Medical Rec #:  [id number]        Height:       63.5 in Accession #:    [id number]       Weight:       170.0 lb Date of Birth:  02/14/1956        BSA:          1.815 m Patient Age:    64 years         BP:           121/88 mmHg Patient Gender: M                HR:           87 bpm. Exam Location:  Inpatient Procedure: 2D Echo, Cardiac Doppler and Color Doppler Indications:    Stroke  History:         Patient has no prior history of Echocardiogram examinations.                 Stroke; Risk Factors:Hypertension and Dyslipidemia.  Sonographer:    Sheralyn Boatman RDCS Referring Phys: [id number] DENISE A WOLFE  Sonographer Comments: Technically difficult study due to poor echo windows. Image acquisition challenging due to patient body habitus. IMPRESSIONS  1. Left ventricular ejection fraction, by estimation, is 60 to 65%. The left ventricle has normal function. The left ventricle has no regional wall motion abnormalities. There is moderate concentric left ventricular hypertrophy. Left ventricular diastolic parameters are consistent with Grade I diastolic dysfunction (impaired relaxation).  2. Right ventricular systolic function is normal. The right ventricular size is normal.  3. The mitral valve is normal in structure. Trivial mitral valve regurgitation.  4. The aortic valve is tricuspid. Aortic valve regurgitation is not visualized.  5. Aortic dilatation noted. There is mild dilatation of the ascending aorta, measuring 37 mm.  6. The inferior vena cava is normal in size with greater than 50% respiratory variability, suggesting right atrial pressure of 3 mmHg. Comparison(s): No prior Echocardiogram. Conclusion(s)/Recommendation(s): No intracardiac source of embolism detected on this transthoracic study. A transesophageal echocardiogram is recommended to exclude cardiac source  of embolism if clinically indicated. FINDINGS  Left Ventricle: Left ventricular ejection fraction, by estimation, is 60 to 65%. The left ventricle has normal function. The left ventricle has no regional wall motion abnormalities. The left ventricular internal cavity size was normal in size. There is  moderate concentric left ventricular hypertrophy. Left ventricular diastolic parameters are consistent with Grade I diastolic dysfunction (impaired relaxation). Right Ventricle: The right ventricular size is normal. Right vetricular wall thickness was not  well visualized. Right ventricular systolic function is normal. Left Atrium: Left atrial size was normal in size. Right Atrium: Right atrial size was normal in size. Pericardium: There is no evidence of pericardial effusion. Mitral Valve: The mitral valve is normal in structure. There is mild thickening of the mitral valve leaflet(s). Trivial mitral valve regurgitation. Tricuspid Valve: The tricuspid valve is normal in structure. Tricuspid valve regurgitation is trivial. Aortic Valve: The aortic valve is tricuspid. Aortic valve regurgitation is not visualized. Pulmonic Valve: The pulmonic valve was grossly normal. Pulmonic valve regurgitation is trivial. Aorta: Aortic dilatation noted. There is mild dilatation of the ascending aorta, measuring 37 mm. Venous: The inferior vena cava is normal in size with greater than 50% respiratory variability, suggesting right atrial pressure of 3 mmHg. IAS/Shunts: No atrial level shunt detected by color flow Doppler.  LEFT VENTRICLE PLAX 2D LVIDd:         3.80 cm     Diastology LVIDs:         2.50 cm     LV e' medial:    5.22 cm/s LV PW:         1.30 cm     LV E/e' medial:  8.6 LV IVS:        1.50 cm     LV e' lateral:   6.85 cm/s LVOT diam:     2.20 cm     LV E/e' lateral: 6.5 LV SV:         62 LV SV Index:   34 LVOT Area:     3.80 cm  LV Volumes (MOD) LV vol d, MOD A2C: 52.9 ml LV vol d, MOD A4C: 77.7 ml LV vol s, MOD A2C: 18.4 ml LV vol s, MOD A4C: 32.6 ml LV SV MOD A2C:     34.5 ml LV SV MOD A4C:     77.7 ml LV SV MOD BP:      39.4 ml RIGHT VENTRICLE RV S prime:     11.20 cm/s TAPSE (M-mode): 2.6 cm LEFT ATRIUM             Index       RIGHT ATRIUM           Index LA diam:        3.70 cm 2.04 cm/m  RA Area:     12.30 cm LA Vol (A2C):   34.9 ml 19.23 ml/m RA Volume:   29.10 ml  16.03 ml/m LA Vol (A4C):   40.1 ml 22.09 ml/m LA Biplane Vol: 40.6 ml 22.37 ml/m  AORTIC VALVE LVOT Vmax:   94.20 cm/s LVOT Vmean:  67.300 cm/s LVOT VTI:    0.163 m  AORTA Ao Root diam: 3.40 cm Ao  Asc diam:  3.70 cm MITRAL VALVE MV Area (PHT): 2.37 cm    SHUNTS MV Decel Time: 320 msec    Systemic VTI:  0.16 m MV E velocity: 44.70 cm/s  Systemic Diam: 2.20 cm MV A velocity: 60.00 cm/s MV E/A ratio:  0.75 Laurance FlattenHeather Pemberton  MD Electronically signed by Laurance Flatten MD Signature Date/Time: 08/08/2020/1:51:41 PM    Final    VAS US CAROTID (at [facility] and [facility] only)  Result Date: 08/08/2020 Carotid Arterial Duplex Study Indications:  TIA and CVA. Risk Factors: Hypertension, hyperlipidemia. Performing Technologist: Jannet Askew  Examination Guidelines: A complete evaluation includes B-mode imaging, spectral Doppler, color Doppler, and power Doppler as needed of all accessible portions of each vessel. Bilateral testing is considered an integral part of a complete examination. Limited examinations for reoccurring indications may be performed as noted.  Right Carotid Findings: +----------+--------+--------+--------+------------------+------------------+           PSV cm/sEDV cm/sStenosisPlaque DescriptionComments           +----------+--------+--------+--------+------------------+------------------+ CCA Prox  137     27                                                   +----------+--------+--------+--------+------------------+------------------+ CCA Distal93      22                                                   +----------+--------+--------+--------+------------------+------------------+ ICA Prox  70      31      1-39%                     intimal thickening +----------+--------+--------+--------+------------------+------------------+ ICA Distal95      44                                                   +----------+--------+--------+--------+------------------+------------------+ ECA       62      12                                                   +----------+--------+--------+--------+------------------+------------------+  +----------+--------+-------+--------+-------------------+           PSV cm/sEDV cmsDescribeArm Pressure (mmHG) +----------+--------+-------+--------+-------------------+ Subclavian117     2                                  +----------+--------+-------+--------+-------------------+ +---------+--------+--+--------+--+---------+ VertebralPSV cm/s52EDV cm/s16Antegrade +---------+--------+--+--------+--+---------+  Left Carotid Findings: +----------+--------+--------+--------+------------------+------------------+           PSV cm/sEDV cm/sStenosisPlaque DescriptionComments           +----------+--------+--------+--------+------------------+------------------+ CCA Prox  135     22                                                   +----------+--------+--------+--------+------------------+------------------+ CCA Distal90      25                                                   +----------+--------+--------+--------+------------------+------------------+  ICA Prox  69      23      1-39%                     intimal thickening +----------+--------+--------+--------+------------------+------------------+ ICA Distal85      34                                                   +----------+--------+--------+--------+------------------+------------------+ ECA       78      17                                                   +----------+--------+--------+--------+------------------+------------------+ +----------+--------+--------+--------+-------------------+           PSV cm/sEDV cm/sDescribeArm Pressure (mmHG) +----------+--------+--------+--------+-------------------+ Subclavian145     39                                  +----------+--------+--------+--------+-------------------+ +---------+--------+--+--------+--+---------+ VertebralPSV cm/s54EDV cm/s21Antegrade +---------+--------+--+--------+--+---------+   Summary: Right Carotid: Velocities in  the right ICA are consistent with a 1-39% stenosis.                The ECA appears <50% stenosed. Left Carotid: Velocities in the left ICA are consistent with a 1-39% stenosis.               The ECA appears <50% stenosed. Vertebrals: Bilateral vertebral arteries demonstrate antegrade flow. *See table(s) above for measurements and observations.     Preliminary     Micro Results    Recent Results (from the past 240 hour(s))  Respiratory Panel by RT PCR (Flu A&B, Covid) - Nasopharyngeal Swab     Status: None   Collection Time: 08/07/20  3:02 PM   Specimen: Nasopharyngeal Swab  Result Value Ref Range Status   SARS Coronavirus 2 by RT PCR NEGATIVE NEGATIVE Final    Comment: (NOTE) SARS-CoV-2 target nucleic acids are NOT DETECTED.  The SARS-CoV-2 RNA is generally detectable in upper respiratoy specimens during the acute phase of infection. The lowest concentration of SARS-CoV-2 viral copies this assay can detect is 131 copies/mL. A negative result does not preclude SARS-Cov-2 infection and should not be used as the sole basis for treatment or other patient management decisions. A negative result may occur with  improper specimen collection/handling, submission of specimen other than nasopharyngeal swab, presence of viral mutation(s) within the areas targeted by this assay, and inadequate number of viral copies (<131 copies/mL). A negative result must be combined with clinical observations, patient history, and epidemiological information. The expected result is Negative.  Fact Sheet for Patients:  https://www.moore.com/  Fact Sheet for Healthcare Providers:  https://www.young.biz/  This test is no t yet approved or cleared by the [place] FDA and  has been authorized for detection and/or diagnosis of SARS-CoV-2 by FDA under an Emergency Use Authorization (EUA). This EUA will remain  in effect (meaning this test can be used) for the duration of  the COVID-19 declaration under Section 564(b)(1) of the Act, 21 U.S.C. section 360bbb-3(b)(1), unless the authorization is terminated or revoked sooner.     Influenza A by PCR NEGATIVE NEGATIVE Final  Influenza B by PCR NEGATIVE NEGATIVE Final    Comment: (NOTE) The Xpert Xpress SARS-CoV-2/FLU/RSV assay is intended as an aid in  the diagnosis of influenza from Nasopharyngeal swab specimens and  should not be used as a sole basis for treatment. Nasal washings and  aspirates are unacceptable for Xpert Xpress SARS-CoV-2/FLU/RSV  testing.  Fact Sheet for Patients: https://www.moore.com/  Fact Sheet for Healthcare Providers: https://www.young.biz/  This test is not yet approved or cleared by the [place] FDA and  has been authorized for detection and/or diagnosis of SARS-CoV-2 by  FDA under an Emergency Use Authorization (EUA). This EUA will remain  in effect (meaning this test can be used) for the duration of the  Covid-19 declaration under Section 564(b)(1) of the Act, 21  U.S.C. section 360bbb-3(b)(1), unless the authorization is  terminated or revoked. Performed at [facility], [address] [place], [place] [address]     Today   Subjective    Jerid Catherman today has no headache,no chest abdominal pain,no new weakness tingling or numbness, feels much better wants to go home today    Objective   Blood pressure (!) 145/91, pulse 84, temperature (!) 97.5 F (36.4 C), temperature source Oral, resp. rate 18, height 5' 3.5" (1.613 m), weight 77.1 kg, SpO2 95 %.   Intake/Output Summary (Last 24 hours) at 08/09/2020 1018 Last data filed at 08/09/2020 0800 Gross per 24 hour  Intake 428.83 ml  Output --  Net 428.83 ml    Exam  Awake Alert, No new F.N deficits, still has diplopia [place].AT,PERRAL Supple Neck,No JVD, No cervical lymphadenopathy appriciated.  Symmetrical Chest wall movement, Good air movement bilaterally,  CTAB RRR,No Gallops, Rubs or new Murmurs, No Parasternal Heave +ve B.Sounds, Abd Soft, No tenderness, No organomegaly appriciated, No rebound - guarding or rigidity. No Cyanosis, Clubbing or edema, No new Rash or bruise    Data Review   CBC w Diff:  Lab Results  Component Value Date   WBC 4.5 08/09/2020   HGB 12.8 (L) 08/09/2020   HCT 39.7 08/09/2020   PLT 212 08/09/2020   LYMPHOPCT 48 08/09/2020   MONOPCT 9 08/09/2020   EOSPCT 5 08/09/2020   BASOPCT 1 08/09/2020    CMP:  Lab Results  Component Value Date   NA 138 08/09/2020   K 3.8 08/09/2020   CL 104 08/09/2020   CO2 25 08/09/2020   BUN 13 08/09/2020   CREATININE 1.09 08/09/2020   PROT 6.8 08/09/2020   ALBUMIN 3.2 (L) 08/09/2020   BILITOT 1.0 08/09/2020   ALKPHOS 61 08/09/2020   AST 15 08/09/2020   ALT 13 08/09/2020    Total Time in preparing paper work, data evaluation and todays exam - 35 minutes  Susa Raring M.D on 08/09/2020 at 10:18 AM  [facility]   Office  [phone]

## 2020-08-09 NOTE — Progress Notes (Signed)
Occupational Therapy Treatment Patient Details Name: Richard Myers MRN: [id number] DOB: [birth year] Today's Date: 08/09/2020    History of present illness  64 y.o. male with past medical history of HTN, HLD and stroke in past who presents to the ED for double vision which has been occurring off and on for the last 2 weeks. MRI head revealed acute right mid brain infarct.    OT comments  "These glasses are really helping me out". Pt feels his vision has improved since yesterday's session. Educated pt on partial occlusion taping technique (taping nasal portion L lens) to improve functional vision. Pt demonstrated/ verbalized understanding. Educated pt on compensatory strategies for low vision and strategies to reduce risk of falls. Pt very appreciative. Pt given resource for services for the blind if needed.  Continue to recommend follow up with OT after DC.   Follow Up Recommendations  Home health OT;Supervision - Intermittent (follow up with eye doctor)    Equipment Recommendations  Tub/shower seat    Recommendations for Other Services  (follow up with eye doctor)    Precautions / Restrictions Precautions Precautions: Fall Restrictions Weight Bearing Restrictions: No       Mobility Bed Mobility Overal bed mobility: Modified Independent                Transfers Overall transfer level: Modified independent                    Balance Overall balance assessment: Mild deficits observed, not formally tested                                         ADL either performed or assessed with clinical judgement   ADL                                         General ADL Comments: Able to complete basic ADL tasks. Educated pt on strategies to reduce sidk of falls at home. Educated on home set up to maximize independence and reduce falls. Pt verbalized understanding. Threasa Alpha his daughter will assist after DC. Educated on compensatory strategies  to use to increase safety with cooking.      Vision   Additional Comments: Pt reports vision is "better than yesterday". Educated on taping technique. Written information reviewed. Pt demosntrated understanding. Blck mark placed above L nasal portion to cue pt to always tape nasal portion of L lens. Educated on compensatory strategies for low vision regarding lighting; reducing clutter and increasing contrast.    Perception     Praxis      Cognition Arousal/Alertness: Awake/alert Behavior During Therapy: WFL for tasks assessed/performed Overall Cognitive Status: Within Functional Limits for tasks assessed                                          Exercises Exercises: Other exercises Other Exercises Other Exercises: activitied to work on depth perceptioin   Shoulder Instructions       General Comments      Pertinent Vitals/ Pain       Pain Assessment: No/denies pain  Home Living  Prior Functioning/Environment              Frequency  Min 3X/week        Progress Toward Goals  OT Goals(current goals can now be found in the care plan section)  Progress towards OT goals: Progressing toward goals  Acute Rehab OT Goals Patient Stated Goal: for his vision to imporve so he can drive and cook again OT Goal Formulation: With patient Time For Goal Achievement: 08/22/20 Potential to Achieve Goals: Good ADL Goals Additional ADL Goal #1: Pt will independetnly demonstrate ability to use taping technique to improve functional vision Additional ADL Goal #2: Pt will independently verbalize 3 compensatory strategies fro double vision Additional ADL Goal #3: Pt will independently verbalzie 3 strategies to reduce risk of falls  Plan Discharge plan remains appropriate    Co-evaluation                 AM-PAC OT "6 Clicks" Daily Activity     Outcome Measure   Help from another person eating  meals?: None Help from another person taking care of personal grooming?: None Help from another person toileting, which includes using toliet, bedpan, or urinal?: None Help from another person bathing (including washing, rinsing, drying)?: None Help from another person to put on and taking off regular upper body clothing?: None Help from another person to put on and taking off regular lower body clothing?: None 6 Click Score: 24    End of Session    OT Visit Diagnosis: Unsteadiness on feet (R26.81);Other (comment)   Activity Tolerance Patient tolerated treatment well   Patient Left in bed;with call bell/phone within reach;with bed alarm set   Nurse Communication Other (comment) (DC needs)        Time: 6720-9470 OT Time Calculation (min): 35 min  Charges: OT General Charges $OT Visit: 1 Visit OT Treatments $Self Care/Home Management : 8-22 mins $Therapeutic Activity: 8-22 mins  Luisa Dago, OT/L   Acute OT Clinical Specialist Acute Rehabilitation Services Pager [phone] Office [phone]    [facility] 08/09/2020, 10:03 AM

## 2020-08-09 NOTE — Progress Notes (Signed)
Physical Therapy Treatment Patient Details Name: Richard Myers MRN: [id number] DOB: September 20, 1956 Today's Date: 08/09/2020    History of Present Illness  64 y.o. male with past medical history of HTN, HLD and stroke in past who presents to the ED for double vision which has been occurring off and on for the last 2 weeks. MRI head revealed acute right mid brain infarct.     PT Comments    Pt is demonstrating improved balance, but continues to stagger laterally intermittently, primarily with changes in head position during ambulation. This is most likely due to him attempting to concentrate on an object anteriorly to improve his vision. Utilized the glasses with tape occluding his vision partially during the session this date. He did display a moment of LOB when reaching down to the floor when standing, and thus was educated to adjust pant legs while sitting for safety at home. His DGI of 15 this date indicates he is at risk for falls, with the stair portion being assumed rather than formally tested. Educated pt on gaze fixation and taking turns slowly to ensure safety in the community. Continue to recommend [facility] PT and will follow acutely to address his deficits and maximize his independence and safety with all functional mobility.    Follow Up Recommendations  Home health PT;Supervision - Intermittent     Equipment Recommendations  None recommended by PT    Recommendations for Other Services       Precautions / Restrictions Precautions Precautions: Fall Restrictions Weight Bearing Restrictions: No    Mobility  Bed Mobility Overal bed mobility: Modified Independent             General bed mobility comments: HOB elevated, but pt able to transition supine to sit R EOB in appropriate amount of time safely.  Transfers Overall transfer level: Needs assistance Equipment used: None Transfers: Sit to/from Stand Sit to Stand: Supervision         General transfer comment: Minor  unsteadiness noted with 1st STS rep, thus supervision for safety.  Ambulation/Gait Ambulation/Gait assistance: Supervision;Min guard Gait Distance (Feet): 300 Feet Assistive device: None Gait Pattern/deviations: Step-through pattern;Decreased step length - left Gait velocity: Decreased Gait velocity interpretation: >2.62 ft/sec, indicative of community ambulatory General Gait Details: Initially displayed decreased step length on L, but improved to symmetrical step length B upon being cued to correct. Displayed occasional minor trunk sway or extra steps laterally to maintain balance, but no overt LOB.    Stairs             Wheelchair Mobility    Modified Rankin (Stroke Patients Only) Modified Rankin (Stroke Patients Only) Pre-Morbid Rankin Score: No symptoms Modified Rankin: Moderately severe disability     Balance Overall balance assessment: Mild deficits observed, not formally tested Sitting-balance support: No upper extremity supported Sitting balance-Leahy Scale: Normal Sitting balance - Comments: Pt able to reach off BOS maximally to donn pants sitting EOB without UE support and no LOB.   Standing balance support: No upper extremity supported Standing balance-Leahy Scale: Good Standing balance comment: Mild deficits in balance noted as he would occasionally take extra steps laterally to maintain his balance. When reaching to the floor to correct pant legs he did display a minor LOB in which he was able to recover with min guard.                 Standardized Balance Assessment Standardized Balance Assessment : Dynamic Gait Index   Dynamic Gait Index Level Surface: Mild  Impairment Change in Gait Speed: Mild Impairment Gait with Horizontal Head Turns: Moderate Impairment Gait with Vertical Head Turns: Moderate Impairment Gait and Pivot Turn: Normal Step Over Obstacle: Mild Impairment Step Around Obstacles: Mild Impairment Steps: Mild Impairment (assumed, did  not formally test) Total Score: 15      Cognition Arousal/Alertness: Awake/alert Behavior During Therapy: WFL for tasks assessed/performed Overall Cognitive Status: Within Functional Limits for tasks assessed                                        Exercises Other Exercises Other Exercises: activitied to work on depth perceptioin    General Comments        Pertinent Vitals/Pain Pain Assessment: No/denies pain    Home Living                      Prior Function            PT Goals (current goals can now be found in the care plan section) Acute Rehab PT Goals Patient Stated Goal: to return home and for his vision to improve PT Goal Formulation: With patient Time For Goal Achievement: 08/22/20 Potential to Achieve Goals: Good Progress towards PT goals: Progressing toward goals    Frequency    Min 4X/week      PT Plan Current plan remains appropriate    Co-evaluation              AM-PAC PT "6 Clicks" Mobility   Outcome Measure  Help needed turning from your back to your side while in a flat bed without using bedrails?: None Help needed moving from lying on your back to sitting on the side of a flat bed without using bedrails?: None Help needed moving to and from a bed to a chair (including a wheelchair)?: A Little Help needed standing up from a chair using your arms (e.g., wheelchair or bedside chair)?: A Little Help needed to walk in hospital room?: A Little Help needed climbing 3-5 steps with a railing? : A Little 6 Click Score: 20    End of Session Equipment Utilized During Treatment: Gait belt Activity Tolerance: Patient tolerated treatment well Patient left: in bed;with call bell/phone within reach;with bed alarm set Nurse Communication: Mobility status PT Visit Diagnosis: Unsteadiness on feet (R26.81);Other symptoms and signs involving the nervous system (R29.898);Other abnormalities of gait and mobility (R26.89)      Time: 4431-5400 PT Time Calculation (min) (ACUTE ONLY): 19 min  Charges:  $Gait Training: 8-22 mins                     Raymond Gurney, PT, DPT Acute Rehabilitation Services  Pager: [phone] Office: [phone]    Jewel Baize 08/09/2020, 10:55 AM

## 2020-08-09 NOTE — Discharge Instructions (Signed)
Do not drive, operate heavy machinery till cleared by the neurologist.  You will take aspirin and Plavix for 3 months, after 3 months stop aspirin and continue taking Plavix only.  He will continue to take Lipitor for the rest of your life.  Follow with Primary MD  in 7 days   Get CBC, CMP, 2 view Chest X ray -  checked next visit within 1 week by Primary MD   Activity: As tolerated with Full fall precautions use walker/cane & assistance as needed  Disposition Home   Diet: Heart Healthy    Special Instructions: If you have smoked or chewed Tobacco  in the last 2 yrs please stop smoking, stop any regular Alcohol  and or any Recreational drug use.  On your next visit with your primary care physician please Get Medicines reviewed and adjusted.  Please request your Prim.MD to go over all Hospital Tests and Procedure/Radiological results at the follow up, please get all Hospital records sent to your Prim MD by signing hospital release before you go home.  If you experience worsening of your admission symptoms, develop shortness of breath, life threatening emergency, suicidal or homicidal thoughts you must seek medical attention immediately by calling 911 or calling your MD immediately  if symptoms less severe.  You Must read complete instructions/literature along with all the possible adverse reactions/side effects for all the Medicines you take and that have been prescribed to you. Take any new Medicines after you have completely understood and accpet all the possible adverse reactions/side effects.

## 2020-08-12 ENCOUNTER — Other Ambulatory Visit: Payer: Self-pay

## 2020-08-12 ENCOUNTER — Ambulatory Visit (INDEPENDENT_AMBULATORY_CARE_PROVIDER_SITE_OTHER): Payer: Self-pay | Admitting: Primary Care

## 2020-08-12 ENCOUNTER — Encounter (INDEPENDENT_AMBULATORY_CARE_PROVIDER_SITE_OTHER): Payer: Self-pay | Admitting: Primary Care

## 2020-08-12 VITALS — BP 130/89 | HR 99 | Temp 97.9°F | Resp 16 | Ht 65.0 in | Wt 184.0 lb

## 2020-08-12 DIAGNOSIS — Z7689 Persons encountering health services in other specified circumstances: Secondary | ICD-10-CM

## 2020-08-12 DIAGNOSIS — E785 Hyperlipidemia, unspecified: Secondary | ICD-10-CM

## 2020-08-12 DIAGNOSIS — I639 Cerebral infarction, unspecified: Secondary | ICD-10-CM

## 2020-08-12 DIAGNOSIS — I10 Essential (primary) hypertension: Secondary | ICD-10-CM

## 2020-08-12 DIAGNOSIS — Z09 Encounter for follow-up examination after completed treatment for conditions other than malignant neoplasm: Secondary | ICD-10-CM

## 2020-08-12 NOTE — Patient Instructions (Signed)

## 2020-08-12 NOTE — Progress Notes (Signed)
Hg A1C- 6.2  4 days ago in hospital

## 2020-08-12 NOTE — Progress Notes (Signed)
Established Patient Office Visit  Subjective:  Patient ID: Richard Myers, male    DOB: [birth year]  Age: 64 y.o. MRN: [id number]  CC:  Chief Complaint  Patient presents with  . Hospitalization Follow-up    HPI [name] is 64 y.o.male presents for follow up from the hospital. Admit date to the hospital was 08/07/20, patient was discharged from the hospital on 08/09/20, patient was admitted for Acute CVA (cerebrovascular accident)   Past Medical History:  Diagnosis Date  . Hyperlipemia   . Hypertension   . Stroke [facility])     History reviewed. No pertinent surgical history.  Family History  Problem Relation Age of Onset  . Arthritis Mother   . Diabetes Mother   . Hypertension Mother   . Alcohol abuse Father     Social History   Socioeconomic History  . Marital status: Single    Spouse name: Not on file  . Number of children: Not on file  . Years of education: Not on file  . Highest education level: Not on file  Occupational History  . Not on file  Tobacco Use  . Smoking status: Never Smoker  . Smokeless tobacco: Never Used  Substance and Sexual Activity  . Alcohol use: Yes    Comment: varies in amount  . Drug use: No  . Sexual activity: Not on file  Other Topics Concern  . Not on file  Social History Narrative  . Not on file   Social Determinants of Health   Financial Resource Strain:   . Difficulty of Paying Living Expenses: Not on file  Food Insecurity:   . Worried About Programme researcher, broadcasting/film/video in the Last Year: Not on file  . Ran Out of Food in the Last Year: Not on file  Transportation Needs:   . Lack of Transportation (Medical): Not on file  . Lack of Transportation (Non-Medical): Not on file  Physical Activity:   . Days of Exercise per Week: Not on file  . Minutes of Exercise per Session: Not on file  Stress:   . Feeling of Stress : Not on file  Social Connections:   . Frequency of Communication with Friends and Family: Not on file   . Frequency of Social Gatherings with Friends and Family: Not on file  . Attends Religious Services: Not on file  . Active Member of Clubs or Organizations: Not on file  . Attends Banker Meetings: Not on file  . Marital Status: Not on file  Intimate Partner Violence:   . Fear of Current or Ex-Partner: Not on file  . Emotionally Abused: Not on file  . Physically Abused: Not on file  . Sexually Abused: Not on file    Outpatient Medications Prior to Visit  Medication Sig Dispense Refill  . aspirin EC 81 MG tablet Take 81 mg by mouth daily.    . clopidogrel (PLAVIX) 75 MG tablet Take 1 tablet (75 mg total) by mouth daily. 30 tablet 0  . lisinopril-hydrochlorothiazide (ZESTORETIC) 10-12.5 MG tablet Take 1 tablet by mouth daily. 90 tablet 3  . atorvastatin (LIPITOR) 40 MG tablet Take 1 tablet (40 mg total) by mouth daily. 30 tablet 0  . diclofenac (VOLTAREN) 75 MG EC tablet Take 1 tablet (75 mg total) by mouth 2 (two) times daily. (Patient not taking: Reported on 08/07/2020) 14 tablet 0  . omeprazole (PRILOSEC) 20 MG capsule Take 20 mg by mouth daily. (Patient not taking: Reported on 08/12/2020)  No facility-administered medications prior to visit.    No Known Allergies  ROS Review of Systems  Eyes: Positive for visual disturbance.  All other systems reviewed and are negative.     Objective:    Physical Exam Vitals reviewed.  Constitutional:      Appearance: Normal appearance. He is obese.  HENT:     Head: Normocephalic.     Right Ear: Tympanic membrane normal.     Left Ear: Tympanic membrane normal.     Nose: Nose normal.  Cardiovascular:     Rate and Rhythm: Normal rate and regular rhythm.  Pulmonary:     Effort: Pulmonary effort is normal.     Breath sounds: Normal breath sounds.  Abdominal:     General: Bowel sounds are normal.     Palpations: Abdomen is soft.  Musculoskeletal:        General: Normal range of motion.     Cervical back: Normal  range of motion.  Skin:    General: Skin is warm.  Neurological:     Mental Status: He is alert and oriented to person, place, and time.  Psychiatric:        Mood and Affect: Mood normal.        Behavior: Behavior normal.        Thought Content: Thought content normal.        Judgment: Judgment normal.     BP 130/89   Pulse 99   Temp 97.9 F (36.6 C) (Oral)   Resp 16   Ht 5\' 5"  (1.651 m)   Wt 184 lb (83.5 kg)   SpO2 97%   BMI 30.62 kg/m  Wt Readings from Last 3 Encounters:  08/12/20 184 lb (83.5 kg)  08/07/20 170 lb (77.1 kg)  01/04/12 150 lb (68 kg)  General Appearance: Well nourished, in no apparent distress.obese male  Eyes: PERRLA, EOMs, conjunctiva no swelling or erythema Sinuses: No Frontal/maxillary tenderness ENT/Mouth: Ext aud canals clear, TMs without erythema, bulging.Hearing normal.  Neck: Supple, thyroid normal.  Respiratory: Respiratory effort normal, BS equal bilaterally without rales, rhonchi, wheezing or stridor.  Cardio: RRR with no MRGs. Brisk peripheral pulses without edema.  Abdomen: Soft, + BS.  Non tender, no guarding, rebound, hernias, masses. Lymphatics: Non tender without lymphadenopathy.  Musculoskeletal: Full ROM, 5/5 strength, normal gait.  Skin: Warm, dry without rashes, lesions, ecchymosis.  Neuro: Cranial nerves intact. Normal muscle tone, no cerebellar symptoms. Sensation intact.  Psych: Awake and oriented X 3, normal affect, Insight and Judgment appropriate.    Health Maintenance Due  Topic Date Due  . Hepatitis C Screening  Never done  . COVID-19 Vaccine (1) Never done  . TETANUS/TDAP  Never done  . COLONOSCOPY  Never done    There are no preventive care reminders to display for this patient.  No results found for: TSH Lab Results  Component Value Date   WBC 3.7 08/12/2020   HGB 14.1 08/12/2020   HCT 44.8 08/12/2020   MCV 73 (L) 08/12/2020   PLT 222 08/12/2020   Lab Results  Component Value Date   NA 135 08/12/2020   K  4.2 08/12/2020   CO2 25 08/12/2020   GLUCOSE 97 08/12/2020   BUN 17 08/12/2020   CREATININE 1.07 08/12/2020   BILITOT 1.0 08/09/2020   ALKPHOS 61 08/09/2020   AST 15 08/09/2020   ALT 13 08/09/2020   PROT 6.8 08/09/2020   ALBUMIN 3.2 (L) 08/09/2020   CALCIUM 9.4 08/12/2020   ANIONGAP  9 08/09/2020   Lab Results  Component Value Date   CHOL 183 08/08/2020   Lab Results  Component Value Date   HDL 36 (L) 08/08/2020   Lab Results  Component Value Date   LDLCALC 121 (H) 08/08/2020   Lab Results  Component Value Date   TRIG 128 08/08/2020   Lab Results  Component Value Date   CHOLHDL 5.1 08/08/2020   Lab Results  Component Value Date   HGBA1C 6.2 (H) 08/08/2020      Assessment & Plan:  Guled was seen today for hospitalization follow-up.  Diagnoses and all orders for this visit:  Essential hypertension Counseled on blood pressure goal of less than 130/80, low-sodium, DASH diet, medication compliance, 150 minutes of moderate intensity exercise per week. Discussed medication compliance, adverse effects. -     Basic Metabolic Panel  Hospital discharge follow-up Retrieved from hospital discharge on 11 /11/2019  Recommendations for Outpatient Follow-up:  Follow up with PCP in 1-2 weeks PCP Please obtain BMP/CBC, 2 view CXR in 1week,  (see Discharge instructions)  PCP Please follow up on the following pending results: Monitor blood pressure, LDL, A1c, needs outpatient neurology follow-up.  Outpatient cardiology follow-up in 3 to 4 weeks. -     CBC with Differential -     Basic Metabolic Panel -     DG Chest 2 View; Future  Acute CVA (cerebrovascular accident) (HCC) -     DG Chest 2 View; Future  Dyslipidemia  Decrease your fatty foods, red meat, cheese, milk and increase fiber like whole grains and veggies. You can also add a fiber supplement like Metamucil or Benefiber.  Refilled atorvastatin 40 mg at bedtime  Encounter to establish care Gwinda Passe,  NP-C will be your  (PCP) she is mastered prepared . Able to diagnosed and treatment also  answer health concern as well as continuing care of varied medical conditions, not limited by cause, organ system, or diagnosis.   Follow-up: Return in about 3 months (around 11/12/2020) for HTN.    Grayce Sessions, NP

## 2020-08-13 LAB — BASIC METABOLIC PANEL
BUN/Creatinine Ratio: 16 (ref 10–24)
BUN: 17 mg/dL (ref 8–27)
CO2: 25 mmol/L (ref 20–29)
Calcium: 9.4 mg/dL (ref 8.6–10.2)
Chloride: 99 mmol/L (ref 96–106)
Creatinine, Ser: 1.07 mg/dL (ref 0.76–1.27)
GFR calc Af Amer: 84 mL/min/{1.73_m2} (ref >59)
GFR calc non Af Amer: 73 mL/min/{1.73_m2} (ref >59)
Glucose: 97 mg/dL (ref 65–99)
Potassium: 4.2 mmol/L (ref 3.5–5.2)
Sodium: 135 mmol/L (ref 134–144)

## 2020-08-13 LAB — CBC WITH DIFFERENTIAL/PLATELET
Basophils Absolute: 0 10*3/uL (ref 0.0–0.2)
Basos: 1 %
EOS (ABSOLUTE): 0.3 10*3/uL (ref 0.0–0.4)
Eos: 8 %
Hematocrit: 44.8 % (ref 37.5–51.0)
Hemoglobin: 14.1 g/dL (ref 13.0–17.7)
Immature Grans (Abs): 0 10*3/uL (ref 0.0–0.1)
Immature Granulocytes: 0 %
Lymphocytes Absolute: 1.7 10*3/uL (ref 0.7–3.1)
Lymphs: 46 %
MCH: 23 pg — ABNORMAL LOW (ref 26.6–33.0)
MCHC: 31.5 g/dL (ref 31.5–35.7)
MCV: 73 fL — ABNORMAL LOW (ref 79–97)
Monocytes Absolute: 0.3 10*3/uL (ref 0.1–0.9)
Monocytes: 8 %
Neutrophils Absolute: 1.4 10*3/uL (ref 1.4–7.0)
Neutrophils: 37 %
Platelets: 222 10*3/uL (ref 150–450)
RBC: 6.13 x10E6/uL — ABNORMAL HIGH (ref 4.14–5.80)
RDW: 16.2 % — ABNORMAL HIGH (ref 11.6–15.4)
WBC: 3.7 10*3/uL (ref 3.4–10.8)

## 2020-08-14 MED ORDER — ATORVASTATIN CALCIUM 40 MG PO TABS
40.0000 mg | ORAL_TABLET | Freq: Every day | ORAL | 1 refills | Status: DC
Start: 2020-08-14 — End: 2021-03-02

## 2020-08-15 ENCOUNTER — Other Ambulatory Visit: Payer: Self-pay | Admitting: *Deleted

## 2020-08-15 NOTE — Patient Outreach (Signed)
[facility]) Care Management  08/15/2020  Richard Myers 02/12/1956 [id number]   [facility] outreach for EMMI- stroke Not on APL RED ON EMMI ALERT Day #     1      Date: 08/11/20 Thursday 1000 Red Alert Reason: Scheduled a follow-up appointment? No Know how/when to take meds? No  Insurance: [place] medicaid family planning   [facility] admissions x  1 ED visits x 1 in the last 6 months  Last admission 08/07/20 dx CVA  To 08/09/20 with home health PT, OT , tub shower seat, cane  Transition of care services noted to be completed by primary care MD office staff- [facility] [facility] - Gwinda Passe, NP Transition of Care will be completed by primary care provider office who will refer to [facility] care management if needed.  Outreach attempt # 1 Patient is able to verify HIPAA identifiers [facility] Care Management RN reviewed and addressed red alert with patient  Consent: [facility] RN CM reviewed [facility] services with patient. Patient gave verbal consent for services [facility] telephonic RN CM. Advised patient that there will be further automated EMMI- post discharge calls to assess how the patient is doing following the recent hospitalization Advised the patient that another call may be received from a nurse if any of their responses were abnormal. Patient voiced understanding and was appreciative of f/u call.   EMMI:  Richard Myers states he has already had a follow up appointment with Westley Hummer, NP on 08/12/20 and will be seen on 08/16/20 by Monico Blitz, NP at [facility] health [facility] wellness center) to be sen by neurology NP on 09/06/20  Richard Myers discussed not being able to take his medicines at the times listed on his discharge instructions as he is not at times awake. [facility] RN CM discussed for him to take the medication when he is awake and with the recommended time in between doses. He was provided examples and voiced understanding  He reports his Hypertension (HTN) is being monitored at  home 131/89 was today's value  [facility] RN CM answered questions about salt intake and alternative seasonings for him He voiced understanding and appreciation   Plan: [facility] RN CM will follow up with Richard Myers in 14-21 business days prior to case closure Pt encouraged to return a call to [facility] RN CM prn Routed note to [facility] RN CM sent a successful outreach letter as discussed with [facility] brochure and a further list of suggestions of spices to replace salt enclosed for review    Richard Sackmann L. Noelle Penner, RN, BSN, CCM [facility] Telephonic Care Management Care Coordinator Office number [phone] Mobile number [phone]  Main [facility] number [phone] Fax number [phone]

## 2020-08-16 ENCOUNTER — Other Ambulatory Visit: Payer: Self-pay

## 2020-08-16 ENCOUNTER — Ambulatory Visit: Payer: Self-pay | Admitting: Nurse Practitioner

## 2020-08-16 NOTE — Progress Notes (Signed)
Established Patient Office Visit  Subjective:  Patient ID: Richard Myers, male    DOB: 16-Mar-1956  Age: 64 y.o. MRN: [id number]  CC:  Chief Complaint  Patient presents with  . Hospitalization Follow-up    HPI Richard Myers is 64 y.o.male presents for follow up from the hospital. Admit date to the hospital was 08/07/20, patient was discharged from the hospital on 08/09/20, patient was admitted for Acute CVA (cerebrovascular accident)   Past Medical History:  Diagnosis Date  . Hyperlipemia   . Hypertension   . Stroke [facility])     History reviewed. No pertinent surgical history.  Family History  Problem Relation Age of Onset  . Arthritis Mother   . Diabetes Mother   . Hypertension Mother   . Alcohol abuse Father     Social History   Socioeconomic History  . Marital status: Single    Spouse name: Not on file  . Number of children: Not on file  . Years of education: Not on file  . Highest education level: Not on file  Occupational History  . Not on file  Tobacco Use  . Smoking status: Never Smoker  . Smokeless tobacco: Never Used  Substance and Sexual Activity  . Alcohol use: Yes    Comment: varies in amount  . Drug use: No  . Sexual activity: Not on file  Other Topics Concern  . Not on file  Social History Narrative  . Not on file   Social Determinants of Health   Financial Resource Strain:   . Difficulty of Paying Living Expenses: Not on file  Food Insecurity:   . Worried About Programme researcher, broadcasting/film/video in the Last Year: Not on file  . Ran Out of Food in the Last Year: Not on file  Transportation Needs:   . Lack of Transportation (Medical): Not on file  . Lack of Transportation (Non-Medical): Not on file  Physical Activity:   . Days of Exercise per Week: Not on file  . Minutes of Exercise per Session: Not on file  Stress:   . Feeling of Stress : Not on file  Social Connections:   . Frequency of Communication with Friends and Family: Not on file  .  Frequency of Social Gatherings with Friends and Family: Not on file  . Attends Religious Services: Not on file  . Active Member of Clubs or Organizations: Not on file  . Attends Banker Meetings: Not on file  . Marital Status: Not on file  Intimate Partner Violence:   . Fear of Current or Ex-Partner: Not on file  . Emotionally Abused: Not on file  . Physically Abused: Not on file  . Sexually Abused: Not on file    Outpatient Medications Prior to Visit  Medication Sig Dispense Refill  . aspirin EC 81 MG tablet Take 81 mg by mouth daily.    . clopidogrel (PLAVIX) 75 MG tablet Take 1 tablet (75 mg total) by mouth daily. 30 tablet 0  . lisinopril-hydrochlorothiazide (ZESTORETIC) 10-12.5 MG tablet Take 1 tablet by mouth daily. 90 tablet 3  . atorvastatin (LIPITOR) 40 MG tablet Take 1 tablet (40 mg total) by mouth daily. 30 tablet 0  . diclofenac (VOLTAREN) 75 MG EC tablet Take 1 tablet (75 mg total) by mouth 2 (two) times daily. (Patient not taking: Reported on 08/07/2020) 14 tablet 0  . omeprazole (PRILOSEC) 20 MG capsule Take 20 mg by mouth daily. (Patient not taking: Reported on 08/12/2020)  No facility-administered medications prior to visit.    No Known Allergies  ROS Review of Systems  Eyes: Positive for visual disturbance.       Binocular vision disorder with diplopia  All other systems reviewed and are negative.     Objective:    Physical Exam  BP 130/89   Pulse 99   Temp 97.9 F (36.6 C) (Oral)   Resp 16   Ht 5\' 5"  (1.651 m)   Wt 184 lb (83.5 kg)   SpO2 97%   BMI 30.62 kg/m  Wt Readings from Last 3 Encounters:  08/12/20 184 lb (83.5 kg)  08/07/20 170 lb (77.1 kg)  01/04/12 150 lb (68 kg)  General Appearance: Well nourished, in no apparent distress.obese male  Eyes:, EOMs, conjunctiva no swelling or erythema Sinuses: No Frontal/maxillary tenderness ENT/Mouth: Ext aud canals clear, TMs without erythema, bulging.Hearing normal.  Neck: Supple,  thyroid normal.  Respiratory: Respiratory effort normal, BS equal bilaterally without rales, rhonchi, wheezing or stridor.  Cardio: RRR with no MRGs. Brisk peripheral pulses without edema.  Abdomen: Soft, + BS.  Non tender, no guarding, rebound, hernias, masses. Lymphatics: Non tender without lymphadenopathy.  Musculoskeletal: Full ROM, 5/5 strength, normal gait.  Skin: Warm, dry without rashes, lesions, ecchymosis.  Neuro: Cranial nerves intact. Normal muscle tone, no cerebellar symptoms. Sensation intact.  Psych: Awake and oriented X 3, normal affect, Insight and Judgment appropriate.    Health Maintenance Due  Topic Date Due  . Hepatitis C Screening  Never done  . COVID-19 Vaccine (1) Never done  . TETANUS/TDAP  Never done  . COLONOSCOPY  Never done    There are no preventive care reminders to display for this patient.  No results found for: TSH Lab Results  Component Value Date   WBC 3.7 08/12/2020   HGB 14.1 08/12/2020   HCT 44.8 08/12/2020   MCV 73 (L) 08/12/2020   PLT 222 08/12/2020   Lab Results  Component Value Date   NA 135 08/12/2020   K 4.2 08/12/2020   CO2 25 08/12/2020   GLUCOSE 97 08/12/2020   BUN 17 08/12/2020   CREATININE 1.07 08/12/2020   BILITOT 1.0 08/09/2020   ALKPHOS 61 08/09/2020   AST 15 08/09/2020   ALT 13 08/09/2020   PROT 6.8 08/09/2020   ALBUMIN 3.2 (L) 08/09/2020   CALCIUM 9.4 08/12/2020   ANIONGAP 9 08/09/2020   Lab Results  Component Value Date   CHOL 183 08/08/2020   Lab Results  Component Value Date   HDL 36 (L) 08/08/2020   Lab Results  Component Value Date   LDLCALC 121 (H) 08/08/2020   Lab Results  Component Value Date   TRIG 128 08/08/2020   Lab Results  Component Value Date   CHOLHDL 5.1 08/08/2020   Lab Results  Component Value Date   HGBA1C 6.2 (H) 08/08/2020      Assessment & Plan:  [name] was seen today for hospitalization follow-up.  Diagnoses and all orders for this visit:  Essential  hypertension Counseled on blood pressure goal of less than 130/80, low-sodium, DASH diet, medication compliance, 150 minutes of moderate intensity exercise per week. Discussed medication compliance, adverse effects. -     Basic Metabolic Panel  Hospital discharge follow-up Retrieved from hospital discharge on 11 /11/2019  Recommendations for Outpatient Follow-up:  Follow up with PCP in 1-2 weeks PCP Please obtain BMP/CBC, 2 view CXR in 1week,  (see Discharge instructions)  PCP Please follow up on the following pending  results: Monitor blood pressure, LDL, A1c, needs outpatient neurology follow-up.  Outpatient cardiology follow-up in 3 to 4 weeks. -     CBC with Differential -     Basic Metabolic Panel -     DG Chest 2 View; Future  Acute CVA (cerebrovascular accident) (HCC) -     DG Chest 2 View; Future  Dyslipidemia  Decrease your fatty foods, red meat, cheese, milk and increase fiber like whole grains and veggies. You can also add a fiber supplement like Metamucil or Benefiber.  Refilled atorvastatin 40 mg at bedtime  Encounter to establish care Gwinda Passe, NP-C will be your  (PCP) she is mastered prepared . Able to diagnosed and treatment also  answer health concern as well as continuing care of varied medical conditions, not limited by cause, organ system, or diagnosis.   Follow-up: Return in about 3 months (around 11/12/2020) for HTN.    Grayce Sessions, NP

## 2020-08-25 ENCOUNTER — Other Ambulatory Visit: Payer: Self-pay | Admitting: *Deleted

## 2020-08-25 NOTE — Patient Outreach (Signed)
[facility]) Care Management  08/25/2020  Richard Myers 1956/07/17 [id number]   [facility] outreach for EMMI- stroke Not on APL RED ON EMMI ALERT Day #     1      Date: 08/11/20 Thursday 1000 Red Alert Reason: Scheduled a follow-up appointment? No Know how/when to take meds? No  And   RED ON EMMI ALERT Day #     13      Date: 08/23/20 Tuesday 1000 Red Alert Reason: [name] to follow-up appointment?No   Insurance: [place] medicaid family planning   [facility] admissions x  1 ED visits x 1 in the last 6 months  Last admission 08/07/20 dx CVA  To 08/09/20 with home health PT, OT , tub shower seat, cane  Transition of care services noted to be completed by primary care MD office staff- [facility] ([place] and wellness center) - Gwinda Passe, NP Transition of Care will be completed by primary care provider office who will refer to [facility] care management if needed.    EMMI:  [name] stated on 08/15/20 he has already had a follow up appointment with Westley Hummer, NP on 08/12/20 and will be seen on 08/16/20 by Monico Blitz, NP at [facility]) to be sen by neurology NP on 09/06/20  The 08/23/20 EMMI documented response is incorrect and was already addressed in the [facility] RN CM outreach on 08/15/20    Plan: [facility] RN CM will follow up with Mr Wander in 14-21 business days prior to case closure Pt encouraged to return a call to [facility] RN CM prn   Cala Bradford L. Noelle Penner, RN, BSN, CCM [facility] Telephonic Care Management Care Coordinator Office number [phone] Main [facility] number [phone] Fax number [phone]

## 2020-08-29 ENCOUNTER — Other Ambulatory Visit: Payer: Self-pay | Admitting: *Deleted

## 2020-08-29 ENCOUNTER — Other Ambulatory Visit: Payer: Self-pay

## 2020-08-29 NOTE — Patient Outreach (Signed)
[facility]) Care Management  08/29/2020  Bogdan Vivona Jul 05, 1956 [id number]   [facility] outreach for EMMI- stroke follow up Not on APL RED ON EMMI ALERT Day #1Date: 08/11/20 Thursday 1000 Red Alert Reason: Scheduled a follow-up appointment? No Know how/when to take meds? No  And   RED ON EMMI ALERT Day #13Date: 08/23/20 Tuesday 1000 Red Alert Reason: [name] to follow-up appointment?No   [place]  [facility] admissions x1ED visits x1in the last 6 months Last admission 08/07/20 dx CVA To 08/09/20 with home health PT, OT , tub shower seat, cane  Transition of care services noted to be completed by primary care MD office staff-[facility] ([place] and wellness center)- Gwinda Passe, NP Transition of Care will be completed by primary care provider office who will refer to [facility] care management if needed.    EMMI: Mr Claxton his been able to go to all his appointments  He is able to tell [facility] RN CM the date and times of his past and future appointments He reports he is doing very well  He denies issues with medicines, transportation or medical increased symptoms He voices appreciation for [facility] RN CM follow up    Plan: [facility] RN CM willfollow complete EMMI case closure (not on APL)  Pt encouraged to return a call to [facility] RN CM prn   Hazelle Woollard L. Noelle Penner, RN, BSN, CCM [facility] Telephonic Care Management Care Coordinator Office number [phone] Main [facility] number [phone] Fax number [phone]

## 2020-09-06 ENCOUNTER — Ambulatory Visit: Payer: Self-pay | Admitting: Adult Health

## 2020-09-06 ENCOUNTER — Encounter: Payer: Self-pay | Admitting: Adult Health

## 2020-09-06 ENCOUNTER — Other Ambulatory Visit: Payer: Self-pay

## 2020-09-06 VITALS — BP 132/76 | HR 77 | Ht 63.0 in | Wt 197.0 lb

## 2020-09-06 DIAGNOSIS — H539 Unspecified visual disturbance: Secondary | ICD-10-CM

## 2020-09-06 DIAGNOSIS — I639 Cerebral infarction, unspecified: Secondary | ICD-10-CM

## 2020-09-06 DIAGNOSIS — R269 Unspecified abnormalities of gait and mobility: Secondary | ICD-10-CM

## 2020-09-06 DIAGNOSIS — I69398 Other sequelae of cerebral infarction: Secondary | ICD-10-CM

## 2020-09-06 DIAGNOSIS — R29818 Other symptoms and signs involving the nervous system: Secondary | ICD-10-CM

## 2020-09-06 NOTE — Patient Instructions (Addendum)
Referral placed for home health therapies - you will be called to schedule initial visits  Referral placed to be evaluated for sleep apnea - you will be called to schedule initial evaluation   Continue clopidogrel 75 mg daily  and atorvastatin  for secondary stroke prevention  Continue to follow up with PCP regarding cholesterol and blood pressure management  Maintain strict control of hypertension with blood pressure goal below 130/90 and cholesterol with LDL cholesterol (bad cholesterol) goal below 70 mg/dL.     Followup in the future with me in 3 months or call earlier if needed     Thank you for coming to see [place] at [facility]. I hope we have been able to provide you high quality care today.  You may receive a patient satisfaction survey over the next few weeks. We would appreciate your feedback and comments so that we may continue to improve ourselves and the health of our patients.     Stroke Prevention Some medical conditions and behaviors are associated with a higher chance of having a stroke. You can help prevent a stroke by making nutrition, lifestyle, and other changes, including managing any medical conditions you may have. What nutrition changes can be made?   Eat healthy foods. You can do this by: ? Choosing foods high in fiber, such as fresh fruits and vegetables and whole grains. ? Eating at least 5 or more servings of fruits and vegetables a day. Try to fill half of your plate at each meal with fruits and vegetables. ? Choosing lean protein foods, such as lean cuts of meat, poultry without skin, fish, tofu, beans, and nuts. ? Eating low-fat dairy products. ? Avoiding foods that are high in salt (sodium). This can help lower blood pressure. ? Avoiding foods that have saturated fat, trans fat, and cholesterol. This can help prevent high cholesterol. ? Avoiding processed and premade foods.  Follow your health care provider's specific guidelines  for losing weight, controlling high blood pressure (hypertension), lowering high cholesterol, and managing diabetes. These may include: ? Reducing your daily calorie intake. ? Limiting your daily sodium intake to 1,500 milligrams (mg). ? Using only healthy fats for cooking, such as olive oil, canola oil, or sunflower oil. ? Counting your daily carbohydrate intake. What lifestyle changes can be made?  Maintain a healthy weight. Talk to your health care provider about your ideal weight.  Get at least 30 minutes of moderate physical activity at least 5 days a week. Moderate activity includes brisk walking, biking, and swimming.  Do not use any products that contain nicotine or tobacco, such as cigarettes and e-cigarettes. If you need help quitting, ask your health care provider. It may also be helpful to avoid exposure to secondhand smoke.  Limit alcohol intake to no more than 1 drink a day for nonpregnant women and 2 drinks a day for men. One drink equals 12 oz of beer, 5 oz of wine, or 1 oz of hard liquor.  Stop any illegal drug use.  Avoid taking birth control pills. Talk to your health care provider about the risks of taking birth control pills if: ? You are over 90 years old. ? You smoke. ? You get migraines. ? You have ever had a blood clot. What other changes can be made?  Manage your cholesterol levels. ? Eating a healthy diet is important for preventing high cholesterol. If cholesterol cannot be managed through diet alone, you may also need to take medicines. ? Take  any prescribed medicines to control your cholesterol as told by your health care provider.  Manage your diabetes. ? Eating a healthy diet and exercising regularly are important parts of managing your blood sugar. If your blood sugar cannot be managed through diet and exercise, you may need to take medicines. ? Take any prescribed medicines to control your diabetes as told by your health care provider.  Control your  hypertension. ? To reduce your risk of stroke, try to keep your blood pressure below 130/80. ? Eating a healthy diet and exercising regularly are an important part of controlling your blood pressure. If your blood pressure cannot be managed through diet and exercise, you may need to take medicines. ? Take any prescribed medicines to control hypertension as told by your health care provider. ? Ask your health care provider if you should monitor your blood pressure at home. ? Have your blood pressure checked every year, even if your blood pressure is normal. Blood pressure increases with age and some medical conditions.  Get evaluated for sleep disorders (sleep apnea). Talk to your health care provider about getting a sleep evaluation if you snore a lot or have excessive sleepiness.  Take over-the-counter and prescription medicines only as told by your health care provider. Aspirin or blood thinners (antiplatelets or anticoagulants) may be recommended to reduce your risk of forming blood clots that can lead to stroke.  Make sure that any other medical conditions you have, such as atrial fibrillation or atherosclerosis, are managed. What are the warning signs of a stroke? The warning signs of a stroke can be easily remembered as BEFAST.  B is for balance. Signs include: ? Dizziness. ? Loss of balance or coordination. ? Sudden trouble walking.  E is for eyes. Signs include: ? A sudden change in vision. ? Trouble seeing.  F is for face. Signs include: ? Sudden weakness or numbness of the face. ? The face or eyelid drooping to one side.  A is for arms. Signs include: ? Sudden weakness or numbness of the arm, usually on one side of the body.  S is for speech. Signs include: ? Trouble speaking (aphasia). ? Trouble understanding.  T is for time. ? These symptoms may represent a serious problem that is an emergency. Do not wait to see if the symptoms will go away. Get medical help right  away. Call your local emergency services (911 in the [place] Do not drive yourself to the hospital.  Other signs of stroke may include: ? A sudden, severe headache with no known cause. ? Nausea or vomiting. ? Seizure. Where to find more information For more information, visit:  American Stroke Association: www.strokeassociation.org  National Stroke Association: www.stroke.org Summary  You can prevent a stroke by eating healthy, exercising, not smoking, limiting alcohol intake, and managing any medical conditions you may have.  Do not use any products that contain nicotine or tobacco, such as cigarettes and e-cigarettes. If you need help quitting, ask your health care provider. It may also be helpful to avoid exposure to secondhand smoke.  Remember BEFAST for warning signs of stroke. Get help right away if you or a loved one has any of these signs. This information is not intended to replace advice given to you by your health care provider. Make sure you discuss any questions you have with your health care provider. Document Revised: 09/06/2017 Document Reviewed: 10/30/2016 Elsevier Patient Education  2020 [employer].

## 2020-09-06 NOTE — Progress Notes (Signed)
I agree with the above plan 

## 2020-09-06 NOTE — Progress Notes (Signed)
[facility] [address] [place].  [address] [phone]       HOSPITAL FOLLOW UP NOTE  [name] Date of Birth:  Dec 08, 1955 Medical Record Number:  [id number]   Reason for Referral:  hospital stroke follow up    SUBJECTIVE:   CHIEF COMPLAINT:  Chief Complaint  Patient presents with  . Hospitalization Follow-up    RM 9  . Cerebrovascular Accident    pt said he notices he feels week and doest want to get up    HPI:   [name] is a 64 y.o. male with history of HTN, HLD and stroke  who presented on 08/07/2020 with double vision x 2 weeks.  Personally reviewed pertinent hospitalization progress notes, lab work and imaging with summary provided.  Evaluated by Dr. Roda Shutters with stroke work-up revealing right midbrain infarct secondary to small vessel disease source.  MRI showed evidence of old b/l basal ganglia and pontine lacunes, and old R frontal lobe white matter and thalamic hemorrhages.  On aspirin PTA -initiated DAPT for 3 weeks and Plavix alone. Hx of HTN stable on high-end with long-term BP goal normotensive range.  LDL 121 and initiate atorvastatin 40 mg daily.  No history of DM with A1c 6.2 indicating prediabetes.  Other stroke risk factors include EtOH use and prior strokes on imaging.  Evaluated by therapies who recommended [facility] PT/OT for residual gait impairment with imbalance and visual deficit and discharged home in stable condition.  Stroke:  R midbrain infarct secondary to small vessel disease source  CT head No acute abnormality. Sinus dz  MRI  R midbrain infarct. Old B basal ganglia and pontine lacunes. Old R frontal lobe white matter and thalamic hemorrhages.   MRA  No LVO. Mild distal L cavernous ICA narrowing w/ prestenotic dilitation  Carotid Doppler  B ICA 1-39% stenosis, VAs antegrade   2D Echo EF 60-65%  LDL 121  HgbA1c 6.2  VTE prophylaxis - Heparin 5000 units sq tid   aspirin 81 mg daily prior to  admission, now on aspirin 81 mg daily and clopidogrel 75 mg daily. Continue DAPT x 3 weeks then plavix alone.    Therapy recommendations:  HH PT, HH OT  Disposition:  Return home  Today, 09/06/2020, Richard Myers is being seen for hospital follow-up unaccompanied.  Reports residual imbalance and visual spatial deficit which has been slowly improving.  Denies any difficulty with double or blurred vision.  He has not done any type of therapy since discharge.  Reports a sensation generalized "weakness" and decreased activity tolerance.  Reports insomnia, excessive daytime fatigue and loud snoring.  He has not previously underwent sleep study. Denies new or worsening stroke/TIA symptoms.  Completed 3 weeks DAPT and remains on plavix alone without bleeding or bruising. Remains on atorvastatin without myalgias.  Blood pressure today satisfactory at 132/76.  No further concerns at this time.     ROS:   14 system review of systems performed and negative with exception of those listed in HPI  PMH:  Past Medical History:  Diagnosis Date  . Hyperlipemia   . Hypertension   . Stroke [facility])     PSH: No past surgical history on file.  Social History:  Social History   Socioeconomic History  . Marital status: Single    Spouse name: Not on file  . Number of children: Not on file  . Years of education: Not on file  . Highest education level: Not on file  Occupational  History  . Not on file  Tobacco Use  . Smoking status: Never Smoker  . Smokeless tobacco: Never Used  Substance and Sexual Activity  . Alcohol use: Yes    Comment: varies in amount  . Drug use: No  . Sexual activity: Not on file  Other Topics Concern  . Not on file  Social History Narrative  . Not on file   Social Determinants of Health   Financial Resource Strain:   . Difficulty of Paying Living Expenses: Not on file  Food Insecurity: No Food Insecurity  . Worried About Programme researcher, broadcasting/film/video in the Last Year: Never true   . Ran Out of Food in the Last Year: Never true  Transportation Needs: No Transportation Needs  . Lack of Transportation (Medical): No  . Lack of Transportation (Non-Medical): No  Physical Activity:   . Days of Exercise per Week: Not on file  . Minutes of Exercise per Session: Not on file  Stress:   . Feeling of Stress : Not on file  Social Connections:   . Frequency of Communication with Friends and Family: Not on file  . Frequency of Social Gatherings with Friends and Family: Not on file  . Attends Religious Services: Not on file  . Active Member of Clubs or Organizations: Not on file  . Attends Banker Meetings: Not on file  . Marital Status: Not on file  Intimate Partner Violence:   . Fear of Current or Ex-Partner: Not on file  . Emotionally Abused: Not on file  . Physically Abused: Not on file  . Sexually Abused: Not on file    Family History:  Family History  Problem Relation Age of Onset  . Arthritis Mother   . Diabetes Mother   . Hypertension Mother   . Alcohol abuse Father     Medications:   Current Outpatient Medications on File Prior to Visit  Medication Sig Dispense Refill  . aspirin EC 81 MG tablet Take 81 mg by mouth daily.    Marland Kitchen atorvastatin (LIPITOR) 40 MG tablet Take 1 tablet (40 mg total) by mouth daily. 90 tablet 1  . clopidogrel (PLAVIX) 75 MG tablet Take 1 tablet (75 mg total) by mouth daily. 30 tablet 0  . diclofenac (VOLTAREN) 75 MG EC tablet Take 1 tablet (75 mg total) by mouth 2 (two) times daily. 14 tablet 0  . lisinopril-hydrochlorothiazide (ZESTORETIC) 10-12.5 MG tablet Take 1 tablet by mouth daily. 90 tablet 3  . omeprazole (PRILOSEC) 20 MG capsule Take 20 mg by mouth daily.      No current facility-administered medications on file prior to visit.    Allergies:  No Known Allergies    OBJECTIVE:  Physical Exam  Vitals:   09/06/20 0913  BP: 132/76  Pulse: 77  Weight: 197 lb (89.4 kg)  Height: 5\' 3"  (1.6 m)   Body mass  index is 34.9 kg/m. No exam data present  Post stroke PHQ 2/9 Depression screen PHQ 2/9 09/06/2020  Decreased Interest 0  Down, Depressed, Hopeless 0  PHQ - 2 Score 0  Altered sleeping -  Tired, decreased energy -  Change in appetite -  Feeling bad or failure about yourself  -  Trouble concentrating -  Moving slowly or fidgety/restless -  Suicidal thoughts -  PHQ-9 Score -    General: well developed, well nourished,  pleasant middle-aged African-American male, seated, in no evident distress Head: head normocephalic and atraumatic.   Neck: supple with no  carotid or supraclavicular bruits Cardiovascular: regular rate and rhythm, no murmurs Musculoskeletal: no deformity Skin:  no rash/petichiae Vascular:  Normal pulses all extremities   Neurologic Exam Mental Status: Awake and fully alert.   Fluent speech and language.  Oriented to place and time. Recent and remote memory intact. Attention span, concentration and fund of knowledge appropriate. Mood and affect appropriate.  Cranial Nerves: Fundoscopic exam reveals sharp disc margins. Pupils equal, briskly reactive to light. Extraocular movements full except left eye uppercase incomplete.  No evidence of nystagmus.  Visual fields full to confrontation. Hearing intact. Facial sensation intact. Face, tongue, palate moves normally and symmetrically.  Motor: Normal bulk and tone. Normal strength in all tested extremity muscles. Sensory.: intact to touch , pinprick , position and vibratory sensation.  Coordination: Rapid alternating movements normal in all extremities. Finger-to-nose and heel-to-shin performed accurately bilaterally. Gait and Station: Arises from chair without difficulty. Stance is normal. Gait demonstrates normal stride length and mild imbalance without use of assistive device.  Difficulty performing tandem walk and heel toe.  Romberg positive. Reflexes: 1+ and symmetric. Toes downgoing.     NIHSS  1 Modified Rankin   2      ASSESSMENT: Richard Myers is a 64 y.o. year old male presented with 2 wk hx of double vision on 08/07/2020 with stroke work-up revealing right midbrain infarct secondary to small vessel disease source. Vascular risk factors include HTN, HLD, prior strokes on imaging (b/l BG and pontine lacunes and R frontal lobe white matter and thalamic hemorrhages) and EtOH use.      PLAN:  1. R midbrain stroke :  a. Residual deficit: Gait impairment with imbalance and visual spatial deficit.  Referral placed to [facility] PT/OT. HH appropriate as he has not returned back to driving nor is safe to do so at this time and has limited transportation assistance b. Continue clopidogrel 75 mg daily  and atorvastatin 40 mg daily for secondary stroke prevention.  c. Discussed secondary stroke prevention measures and importance of close PCP follow up for aggressive stroke risk factor management  2. HTN: BP goal <130/90.  Stable on lisinopril-hydrochlorothiazide per PCP 3. HLD: LDL goal <70. Recent LDL 121.  Initiated atorvastatin 40 mg daily during stroke prevention.  Request follow-up with PCP for repeat lipid panel in the next 1 to 2 months as well as ongoing prescribing of statin 4. Pre-DMII: A1c goal<7.0. Recent A1c 6.2.  Monitored by PCP 5. Suspected sleep apnea: Referral placed to [facility] sleep clinic for further evaluation    Follow up in 3 months or call earlier if needed   CC:  [facility] provider: Dr. Mosetta Anis, Kinnie Scales, NP     I spent 45 minutes of face-to-face and non-face-to-face time with patient.  This included previsit chart review including recent hospitalization pertinent progress notes, lab work and imaging, lab review, study review, order entry, electronic health record documentation, patient education regarding recent stroke including etiology, residual deficits, importance of managing stroke risk factors and answered all questions to patient satisfaction   Ihor Austin,  AGNP-BC  [facility] [address] [place], [place] [address]  Phone [phone] Fax [phone] Note: This document was prepared with digital dictation and possible smart phrase technology. Any transcriptional errors that result from this process are unintentional.

## 2020-09-07 ENCOUNTER — Telehealth (INDEPENDENT_AMBULATORY_CARE_PROVIDER_SITE_OTHER): Payer: Self-pay | Admitting: Primary Care

## 2020-09-07 NOTE — Telephone Encounter (Signed)
clopidogrel (PLAVIX) 75 MG tablet Medication Date: 08/09/2020 Department: [facility] Ordering/Authorizing: Leroy Sea, MD  Order Providers  Prescribing Provider Encounter Provider  Leroy Sea, MD None  Outpatient Medication Detail   Disp Refills Start End   clopidogrel (PLAVIX) 75 MG tablet 30 tablet 0 08/09/2020     Pt states this was prescribed in the hospital and wants Dr Randa Evens to refill, only has 1 more pill?

## 2020-09-08 ENCOUNTER — Telehealth (INDEPENDENT_AMBULATORY_CARE_PROVIDER_SITE_OTHER): Payer: Self-pay | Admitting: Primary Care

## 2020-09-08 ENCOUNTER — Telehealth: Payer: Self-pay | Admitting: Adult Health

## 2020-09-08 MED ORDER — CLOPIDOGREL BISULFATE 75 MG PO TABS
75.0000 mg | ORAL_TABLET | Freq: Every day | ORAL | 3 refills | Status: DC
Start: 2020-09-08 — End: 2021-01-13

## 2020-09-08 NOTE — Telephone Encounter (Signed)
1) Medication(s) Requested (by name):   CLOPIDOGREL 75 MG TABLETS  2) Pharmacy of Choice: [facility] [address]  3) Special Requests:

## 2020-09-08 NOTE — Telephone Encounter (Signed)
I called the pt to get him schedule for his sleep consult. The pt wanted me to let you know that is he went to the pharmacy to pick up his medication and he only got the Lipitor. Pt is requesting the prescription for the Plavix to be sent to his pharmacy if it has not already been done. Please advise.

## 2020-09-08 NOTE — Addendum Note (Signed)
Addended by: Guy Begin on: 09/08/2020 04:46 PM   Modules accepted: Orders

## 2020-09-08 NOTE — Telephone Encounter (Signed)
Sent to PCP to refill if appropriate.  

## 2020-10-12 ENCOUNTER — Institutional Professional Consult (permissible substitution): Payer: Medicaid Other | Admitting: Neurology

## 2020-10-12 ENCOUNTER — Encounter: Payer: Self-pay | Admitting: Neurology

## 2020-11-14 ENCOUNTER — Ambulatory Visit (INDEPENDENT_AMBULATORY_CARE_PROVIDER_SITE_OTHER): Payer: Medicare Other | Admitting: Primary Care

## 2020-11-14 ENCOUNTER — Other Ambulatory Visit: Payer: Self-pay

## 2020-11-14 ENCOUNTER — Encounter (INDEPENDENT_AMBULATORY_CARE_PROVIDER_SITE_OTHER): Payer: Self-pay | Admitting: Primary Care

## 2020-11-14 VITALS — BP 173/118 | HR 80 | Temp 97.3°F | Ht 63.0 in | Wt 198.0 lb

## 2020-11-14 DIAGNOSIS — I10 Essential (primary) hypertension: Secondary | ICD-10-CM

## 2020-11-14 DIAGNOSIS — E785 Hyperlipidemia, unspecified: Secondary | ICD-10-CM

## 2020-11-14 DIAGNOSIS — Z23 Encounter for immunization: Secondary | ICD-10-CM | POA: Diagnosis not present

## 2020-11-14 DIAGNOSIS — I639 Cerebral infarction, unspecified: Secondary | ICD-10-CM

## 2020-11-14 MED ORDER — LISINOPRIL-HYDROCHLOROTHIAZIDE 20-25 MG PO TABS: 1.0000 | ORAL_TABLET | Freq: Every day | ORAL | 3 refills | Status: DC

## 2020-11-14 MED ORDER — AMLODIPINE BESYLATE 10 MG PO TABS: 10.0000 mg | ORAL_TABLET | Freq: Every day | ORAL | 3 refills | Status: DC

## 2020-11-14 NOTE — Patient Instructions (Signed)
Tdap (Tetanus, Diphtheria, Pertussis) Vaccine: What You Need to Know 1. Why get vaccinated? Tdap vaccine can prevent tetanus, diphtheria, and pertussis. Diphtheria and pertussis spread from person to person. Tetanus enters the body through cuts or wounds.  TETANUS (T) causes painful stiffening of the muscles. Tetanus can lead to serious health problems, including being unable to open the mouth, having trouble swallowing and breathing, or death.  DIPHTHERIA (D) can lead to difficulty breathing, heart failure, paralysis, or death.  PERTUSSIS (aP), also known as "whooping cough," can cause uncontrollable, violent coughing that makes it hard to breathe, eat, or drink. Pertussis can be extremely serious especially in babies and young children, causing pneumonia, convulsions, brain damage, or death. In teens and adults, it can cause weight loss, loss of bladder control, passing out, and rib fractures from severe coughing. 2. Tdap vaccine Tdap is only for children 7 years and older, adolescents, and adults.  Adolescents should receive a single dose of Tdap, preferably at age 11 or 12 years. Pregnant people should get a dose of Tdap during every pregnancy, preferably during the early part of the third trimester, to help protect the newborn from pertussis. Infants are most at risk for severe, life-threatening complications from pertussis. Adults who have never received Tdap should get a dose of Tdap. Also, adults should receive a booster dose of either Tdap or Td (a different vaccine that protects against tetanus and diphtheria but not pertussis) every 10 years, or after 5 years in the case of a severe or dirty wound or burn. Tdap may be given at the same time as other vaccines. 3. Talk with your health care provider Tell your vaccine provider if the person getting the vaccine:  Has had an allergic reaction after a previous dose of any vaccine that protects against tetanus, diphtheria, or pertussis, or  has any severe, life-threatening allergies  Has had a coma, decreased level of consciousness, or prolonged seizures within 7 days after a previous dose of any pertussis vaccine (DTP, DTaP, or Tdap)  Has seizures or another nervous system problem  Has ever had Guillain-Barr Syndrome (also called "GBS")  Has had severe pain or swelling after a previous dose of any vaccine that protects against tetanus or diphtheria In some cases, your health care provider may decide to postpone Tdap vaccination until a future visit. People with minor illnesses, such as a cold, may be vaccinated. People who are moderately or severely ill should usually wait until they recover before getting Tdap vaccine.  Your health care provider can give you more information. 4. Risks of a vaccine reaction  Pain, redness, or swelling where the shot was given, mild fever, headache, feeling tired, and nausea, vomiting, diarrhea, or stomachache sometimes happen after Tdap vaccination. People sometimes faint after medical procedures, including vaccination. Tell your provider if you feel dizzy or have vision changes or ringing in the ears.  As with any medicine, there is a very remote chance of a vaccine causing a severe allergic reaction, other serious injury, or death. 5. What if there is a serious problem? An allergic reaction could occur after the vaccinated person leaves the clinic. If you see signs of a severe allergic reaction (hives, swelling of the face and throat, difficulty breathing, a fast heartbeat, dizziness, or weakness), call 9-1-1 and get the person to the nearest hospital. For other signs that concern you, call your health care provider.  Adverse reactions should be reported to the Vaccine Adverse Event Reporting System (VAERS). Your health   care provider will usually file this report, or you can do it yourself. Visit the VAERS website at www.vaers.hhs.gov or call 1-800-822-7967. VAERS is only for reporting  reactions, and VAERS staff members do not give medical advice. 6. The National Vaccine Injury Compensation Program The National Vaccine Injury Compensation Program (VICP) is a federal program that was created to compensate people who may have been injured by certain vaccines. Claims regarding alleged injury or death due to vaccination have a time limit for filing, which may be as short as two years. Visit the VICP website at www.hrsa.gov/vaccinecompensation or call 1-800-338-2382 to learn about the program and about filing a claim. 7. How can I learn more?  Ask your health care provider.  Call your local or state health department.  Visit the website of the Food and Drug Administration (FDA) for vaccine package inserts and additional information at www.fda.gov/vaccines-blood-biologics/vaccines.  Contact the Centers for Disease Control and Prevention (CDC): ? Call 1-800-232-4636 (1-800-CDC-INFO) or ? Visit CDC's website at www.cdc.gov/vaccines. Vaccine Information Statement Tdap (Tetanus, Diphtheria, Pertussis) Vaccine (05/13/2020) This information is not intended to replace advice given to you by your health care provider. Make sure you discuss any questions you have with your health care provider. Document Revised: 06/08/2020 Document Reviewed: 06/08/2020 Elsevier Patient Education  2021 Elsevier Inc.  

## 2020-11-14 NOTE — Progress Notes (Signed)
[facility]   Presents for  hypertension evaluation, on previous visit medication was adjusted to include none . Today will add amlodipine 10mg  daily and increase lisinopril 20/ HCTZ 25mg   Take daily. Discussed rick of another stroke. However he admits he had a family reunion and enjoyed the food. Patient reports adherence with medications.  Current Medication List Current Outpatient Medications on File Prior to Visit  Medication Sig Dispense Refill  . atorvastatin (LIPITOR) 40 MG tablet Take 1 tablet (40 mg total) by mouth daily. 90 tablet 1  . clopidogrel (PLAVIX) 75 MG tablet Take 1 tablet (75 mg total) by mouth daily. 30 tablet 3  . omeprazole (PRILOSEC) 20 MG capsule Take 20 mg by mouth daily.      No current facility-administered medications on file prior to visit.   Past Medical History  Past Medical History:  Diagnosis Date  . Hyperlipemia   . Hypertension   . Stroke [facility])    Dietary habits include: using salt substitute and and watching sodium intake  Exercise habits include:no Family / Social history: Grandmother and mother heart attack  ASCVD risk factors include-  O:  Physical Exam Vitals reviewed.  Constitutional:      Appearance: Normal appearance. He is obese.  HENT:     Right Ear: External ear normal.     Left Ear: External ear normal.     Nose: Nose normal.  Eyes:     Extraocular Movements: Extraocular movements intact.  Cardiovascular:     Rate and Rhythm: Normal rate and regular rhythm.  Pulmonary:     Effort: Pulmonary effort is normal.     Breath sounds: Normal breath sounds.  Abdominal:     General: Bowel sounds are normal. There is distension.     Palpations: Abdomen is soft.  Musculoskeletal:        General: Normal range of motion.     Cervical back: Normal range of motion and neck supple.  Skin:    General: Skin is warm and dry.  Neurological:     Mental Status: He is oriented to person, place, and time.  Psychiatric:         Mood and Affect: Mood normal.        Behavior: Behavior normal.        Thought Content: Thought content normal.        Judgment: Judgment normal.      Review of Systems  All other systems reviewed and are negative.   Last 3 Office BP readings: BP Readings from Last 3 Encounters:  11/14/20 (!) 173/118  09/06/20 132/76  08/15/20 131/89    BMET    Component Value Date/Time   NA 135 08/12/2020 1049   K 4.2 08/12/2020 1049   CL 99 08/12/2020 1049   CO2 25 08/12/2020 1049   GLUCOSE 97 08/12/2020 1049   GLUCOSE 112 (H) 08/09/2020 0435   BUN 17 08/12/2020 1049   CREATININE 1.07 08/12/2020 1049   CALCIUM 9.4 08/12/2020 1049   GFRNONAA 73 08/12/2020 1049   GFRNONAA >60 08/09/2020 0435   GFRAA 84 08/12/2020 1049    Renal function: CrCl cannot be calculated (Patient's most recent lab result is older than the maximum 21 days allowed.).  Clinical ASCVD: Yes  The ASCVD Risk score 13/11/2019 DC Jr., et al., 2013) failed to calculate for the following reasons:   The patient has a prior MI or stroke diagnosis   A/P: Essential hypertension Hypertension longstanding lisinopril /hctz 10/12.5 mg daily  on current medications. BP Goal = 130/80 mmHg. Patient is adherent with current medications.  -Added amlodipine and increased lisinopril/HCTZ 20/25.  -F/u labs ordered - CMP/Lipid  -Counseled on lifestyle modifications for blood pressure control including reduced dietary sodium, increased exercise, adequate sleep  Need for Tdap vaccination -     Tdap vaccine greater than or equal to 7yo IM  -   Acute CVA (cerebrovascular accident) (HCC) Followed by neurology  On Plavix 75mg  daily  -     amLODipine (NORVASC) 10 MG tablet; Take 1 tablet (10 mg total) by mouth daily. -     lisinopril-hydrochlorothiazide (ZESTORETIC) 20-25 MG tablet; Take 1 tablet by mouth daily. -     Lipid Panel  Dyslipidemia On atorvastatin 40mg   LDL > 70 recheck to day goal <70. Your LDL is not in range.  Decrease your fatty foods, red meat, cheese, milk and increase fiber like whole grains and veggies. -      Lipid Panel   

## 2020-11-15 LAB — COMPREHENSIVE METABOLIC PANEL
ALT: 13 IU/L (ref 0–44)
AST: 13 IU/L (ref 0–40)
Albumin/Globulin Ratio: 1.1 — ABNORMAL LOW (ref 1.2–2.2)
Albumin: 4 g/dL (ref 3.8–4.8)
Alkaline Phosphatase: 113 IU/L (ref 44–121)
BUN/Creatinine Ratio: 14 (ref 10–24)
BUN: 15 mg/dL (ref 8–27)
Bilirubin Total: 0.7 mg/dL (ref 0.0–1.2)
CO2: 27 mmol/L (ref 20–29)
Calcium: 9.3 mg/dL (ref 8.6–10.2)
Chloride: 101 mmol/L (ref 96–106)
Creatinine, Ser: 1.06 mg/dL (ref 0.76–1.27)
GFR calc Af Amer: 85 mL/min/{1.73_m2} (ref >59)
GFR calc non Af Amer: 73 mL/min/{1.73_m2} (ref >59)
Globulin, Total: 3.6 g/dL (ref 1.5–4.5)
Glucose: 112 mg/dL — ABNORMAL HIGH (ref 65–99)
Potassium: 3.8 mmol/L (ref 3.5–5.2)
Sodium: 141 mmol/L (ref 134–144)
Total Protein: 7.6 g/dL (ref 6.0–8.5)

## 2020-11-15 LAB — LIPID PANEL
Chol/HDL Ratio: 4 ratio (ref 0.0–5.0)
Cholesterol, Total: 158 mg/dL (ref 100–199)
HDL: 40 mg/dL (ref >39)
LDL Chol Calc (NIH): 83 mg/dL (ref 0–99)
Triglycerides: 208 mg/dL — ABNORMAL HIGH (ref 0–149)
VLDL Cholesterol Cal: 35 mg/dL (ref 5–40)

## 2020-11-17 ENCOUNTER — Telehealth (INDEPENDENT_AMBULATORY_CARE_PROVIDER_SITE_OTHER): Payer: Self-pay

## 2020-11-17 NOTE — Telephone Encounter (Signed)
-----   Message from Grayce Sessions, NP sent at 11/17/2020  3:36 PM EST ----- Labs are normal. Make sure you are drinking at least 48 oz of water per day. Work on eating a low fat, heart healthy diet and participate in regular aerobic exercise program to control as well. Exercise at least  30 minutes per day-5 days per week. Avoid red meat. No fried foods. No junk foods, sodas, sugary foods or drinks, unhealthy snacking, alcohol or smoking.

## 2020-11-17 NOTE — Telephone Encounter (Signed)
Per DPR left voicemail notifying patient that labs are normal. Richard Myers, CMA

## 2020-11-28 ENCOUNTER — Encounter (INDEPENDENT_AMBULATORY_CARE_PROVIDER_SITE_OTHER): Payer: Self-pay | Admitting: Primary Care

## 2020-11-28 ENCOUNTER — Ambulatory Visit (INDEPENDENT_AMBULATORY_CARE_PROVIDER_SITE_OTHER): Payer: Medicare Other | Admitting: Primary Care

## 2020-11-28 ENCOUNTER — Other Ambulatory Visit: Payer: Self-pay

## 2020-11-28 VITALS — BP 124/90 | HR 112 | Temp 97.3°F | Ht 63.0 in | Wt 192.6 lb

## 2020-11-28 DIAGNOSIS — I10 Essential (primary) hypertension: Secondary | ICD-10-CM | POA: Diagnosis not present

## 2020-11-28 NOTE — Progress Notes (Signed)
[facility]    [name]  Is a 65 year old obese male presents for  hypertension evaluation, on previous visit medication was adjusted to include amlodipine and increased lisinopril/HCTZ 20/25.  Patient reports adherence with medications.  Current Medication List Current Outpatient Medications on File Prior to Visit  Medication Sig Dispense Refill  . amLODipine (NORVASC) 10 MG tablet Take 1 tablet (10 mg total) by mouth daily. 90 tablet 3  . atorvastatin (LIPITOR) 40 MG tablet Take 1 tablet (40 mg total) by mouth daily. 90 tablet 1  . clopidogrel (PLAVIX) 75 MG tablet Take 1 tablet (75 mg total) by mouth daily. 30 tablet 3  . lisinopril-hydrochlorothiazide (ZESTORETIC) 20-25 MG tablet Take 1 tablet by mouth daily. 90 tablet 3  . omeprazole (PRILOSEC) 20 MG capsule Take 20 mg by mouth daily.      No current facility-administered medications on file prior to visit.   Past Medical History  Past Medical History:  Diagnosis Date  . Hyperlipemia   . Hypertension   . Stroke [facility])    Dietary habits include: trying to decrease but ate 2 Hot dogs on Saturday Exercise habits include: No ( other than cleaning house) Family / Social history: Grand mother stroke grandfather aneurysm  ASCVD risk factors include- [place]  O:  Physical Exam Vitals:   11/28/20 1000  BP: (!) 150/95  Pulse: (!) 112  Temp: (!) 97.3 F (36.3 C)  TempSrc: Temporal  SpO2: 97%  Weight: 192 lb 9.6 oz (87.4 kg)  Height: 5\' 3"  (1.6 m)   General: Vital signs reviewed.  Patient is well-developed and well-nourished,obese male  in no acute distress and cooperative with exam.  Head: Normocephalic and atraumatic. Eyes: EOMI, conjunctivae normal, no scleral icterus.  Neck: Supple, trachea midline, normal ROM, no JVD, masses, thyromegaly, or carotid bruit present.  Cardiovascular: RRR, S1 normal, S2 normal, no murmurs, gallops, or rubs. Pulmonary/Chest: Clear to auscultation bilaterally, no  wheezes, rales, or rhonchi. Abdominal: Soft, non-tender, non-distended, BS +, no masses, organomegaly, or guarding present.  Musculoskeletal: No joint deformities, erythema, or stiffness, ROM full and nontender. Extremities: No lower extremity edema bilaterally,  pulses symmetric and intact bilaterally. No cyanosis or clubbing. Neurological: A&O x3, Strength is normal and symmetric bilaterally Skin: Warm, dry and intact. No rashes or erythema. Psychiatric: Normal mood and affect. speech and behavior is normal. Cognition and memory are normal.   Review of Systems  All other systems reviewed and are negative.   Last 3 Office BP readings: BP Readings from Last 3 Encounters:  11/28/20 (!) 150/95  11/14/20 (!) 173/118  09/06/20 132/76    BMET    Component Value Date/Time   NA 141 11/14/2020 1140   K 3.8 11/14/2020 1140   CL 101 11/14/2020 1140   CO2 27 11/14/2020 1140   GLUCOSE 112 (H) 11/14/2020 1140   GLUCOSE 112 (H) 08/09/2020 0435   BUN 15 11/14/2020 1140   CREATININE 1.06 11/14/2020 1140   CALCIUM 9.3 11/14/2020 1140   GFRNONAA 73 11/14/2020 1140   GFRNONAA >60 08/09/2020 0435   GFRAA 85 11/14/2020 1140    Renal function: Estimated Creatinine Clearance: 67.9 mL/min (by C-G formula based on SCr of 1.06 mg/dL).  Clinical ASCVD: No  The ASCVD Risk score 01/12/2021 DC Jr., et al., 2013) failed to calculate for the following reasons:   The patient has a prior MI or stroke diagnosis   A/P: Richard Myers was seen today for blood pressure check.  Diagnoses and all  orders for this visit:  Essential hypertension Hypertension longstanding currently amlodipine and lisinopril/HCTZ 20/25. on current medications. BP Goal = 130/80  mmHg. Patient is adherent with current medications.  -Continued  -F/u labs ordered - none  -Counseled on lifestyle modifications for blood pressure control including reduced dietary sodium, increased exercise, adequate sleep  Grayce Sessions

## 2020-12-27 ENCOUNTER — Encounter: Payer: Self-pay | Admitting: Adult Health

## 2020-12-27 ENCOUNTER — Ambulatory Visit (INDEPENDENT_AMBULATORY_CARE_PROVIDER_SITE_OTHER): Payer: Medicare Other | Admitting: Adult Health

## 2020-12-27 VITALS — BP 130/69 | HR 84 | Ht 63.0 in | Wt 191.0 lb

## 2020-12-27 DIAGNOSIS — I639 Cerebral infarction, unspecified: Secondary | ICD-10-CM

## 2020-12-27 NOTE — Patient Instructions (Addendum)
Continue clopidogrel 75 mg daily  and atorvastatin 40 mg daily for secondary stroke prevention  Recommend rescheduling prior scheduled visit with sleep clinic to assess for possible sleep apnea  Continue to follow up with PCP regarding cholesterol and blood pressure management  Maintain strict control of hypertension with blood pressure goal below 130/90 and cholesterol with LDL cholesterol (bad cholesterol) goal below 70 mg/dL.      Followup in the future with me in 6 months or call earlier if needed      Thank you for coming to see [place] at [facility]. I hope we have been able to provide you high quality care today.  You may receive a patient satisfaction survey over the next few weeks. We would appreciate your feedback and comments so that we may continue to improve ourselves and the health of our patients.

## 2020-12-27 NOTE — Progress Notes (Signed)
[facility] [address] [place]. [place] [address] [phone]       STROKE FOLLOW UP NOTE  [name] Date of Birth:  1956/02/02 Medical Record Number:  [id number]   Reason for Referral: stroke follow up    SUBJECTIVE:   CHIEF COMPLAINT:  Chief Complaint  Patient presents with  . Follow-up    RM 14 alone Pt is well, no complaints     HPI:   Today, 12/27/2020, Richard Myers returns for stroke follow-up after prior visit approximately 4 months ago.  Doing well since prior visit - reports complete recovery since prior visit Denies new stroke/TIA symptoms  Compliant on Plavix and atorvastatin 40mg  -denies associated side effects Blood pressure today 130/69. amlodipine added 2/7 in addition to lisinopril-hydrochlorothiazide - monitors at home which has been stable  Recent lipid panel 11/2020 LDL 83 down from 121  Referred to [facility] sleep clinic for possible underlying sleep apnea - no show to initial evaluation with Dr. 12/2020 -he has not rescheduled - he reports improvement of insomnia but occasional day time fatigue but has been improving since he started to take vitamins (ACEs+Zinc)  No concerns at this time     History provided for reference purposes only Initial visit 09/06/2020 JM: Richard Myers is being seen for hospital follow-up unaccompanied.  Reports residual imbalance and visual spatial deficit which has been slowly improving.  Denies any difficulty with double or blurred vision.  He has not done any type of therapy since discharge.  Reports a sensation generalized "weakness" and decreased activity tolerance.  Reports insomnia, excessive daytime fatigue and loud snoring.  He has not previously underwent sleep study. Denies new or worsening stroke/TIA symptoms.  Completed 3 weeks DAPT and remains on plavix alone without bleeding or bruising. Remains on atorvastatin without myalgias.  Blood pressure today satisfactory at 132/76.  No further  concerns at this time.  Stroke admission 08/07/2020 Richard Myers is a 65 y.o. male with history of HTN, HLD and stroke  who presented on 08/07/2020 with double vision x 2 weeks.  Personally reviewed pertinent hospitalization progress notes, lab work and imaging with summary provided.  Evaluated by Dr. 08/09/2020 with stroke work-up revealing right midbrain infarct secondary to small vessel disease source.  MRI showed evidence of old b/l basal ganglia and pontine lacunes, and old R frontal lobe white matter and thalamic hemorrhages.  On aspirin PTA -initiated DAPT for 3 weeks and Plavix alone. Hx of HTN stable on high-end with long-term BP goal normotensive range.  LDL 121 and initiate atorvastatin 40 mg daily.  No history of DM with A1c 6.2 indicating prediabetes.  Other stroke risk factors include EtOH use and prior strokes on imaging.  Evaluated by therapies who recommended [facility] PT/OT for residual gait impairment with imbalance and visual deficit and discharged home in stable condition.  Stroke:  R midbrain infarct secondary to small vessel disease source  CT head No acute abnormality. Sinus dz  MRI  R midbrain infarct. Old B basal ganglia and pontine lacunes. Old R frontal lobe white matter and thalamic hemorrhages.   MRA  No LVO. Mild distal L cavernous ICA narrowing w/ prestenotic dilitation  Carotid Doppler  B ICA 1-39% stenosis, VAs antegrade   2D Echo EF 60-65%  LDL 121  HgbA1c 6.2  VTE prophylaxis - Heparin 5000 units sq tid   aspirin 81 mg daily prior to admission, now on aspirin 81 mg daily and clopidogrel 75 mg daily. Continue DAPT x  3 weeks then plavix alone.    Therapy recommendations:  HH PT, HH OT  Disposition:  Return home      ROS:   14 system review of systems performed and negative with exception of those listed in HPI  PMH:  Past Medical History:  Diagnosis Date  . Hyperlipemia   . Hypertension   . Stroke [facility])     PSH: History reviewed. No pertinent  surgical history.  Social History:  Social History   Socioeconomic History  . Marital status: Single    Spouse name: Not on file  . Number of children: Not on file  . Years of education: Not on file  . Highest education level: Not on file  Occupational History  . Not on file  Tobacco Use  . Smoking status: Never Smoker  . Smokeless tobacco: Never Used  Substance and Sexual Activity  . Alcohol use: Yes    Comment: varies in amount  . Drug use: No  . Sexual activity: Not on file  Other Topics Concern  . Not on file  Social History Narrative  . Not on file   Social Determinants of Health   Financial Resource Strain: Not on file  Food Insecurity: No Food Insecurity  . Worried About Programme researcher, broadcasting/film/video in the Last Year: Never true  . Ran Out of Food in the Last Year: Never true  Transportation Needs: No Transportation Needs  . Lack of Transportation (Medical): No  . Lack of Transportation (Non-Medical): No  Physical Activity: Not on file  Stress: Not on file  Social Connections: Not on file  Intimate Partner Violence: Not on file    Family History:  Family History  Problem Relation Age of Onset  . Arthritis Mother   . Diabetes Mother   . Hypertension Mother   . Alcohol abuse Father     Medications:   Current Outpatient Medications on File Prior to Visit  Medication Sig Dispense Refill  . amLODipine (NORVASC) 10 MG tablet Take 1 tablet (10 mg total) by mouth daily. 90 tablet 3  . atorvastatin (LIPITOR) 40 MG tablet Take 1 tablet (40 mg total) by mouth daily. 90 tablet 1  . clopidogrel (PLAVIX) 75 MG tablet Take 1 tablet (75 mg total) by mouth daily. 30 tablet 3  . lisinopril-hydrochlorothiazide (ZESTORETIC) 20-25 MG tablet Take 1 tablet by mouth daily. 90 tablet 3  . omeprazole (PRILOSEC) 20 MG capsule Take 20 mg by mouth daily.      No current facility-administered medications on file prior to visit.    Allergies:  No Known  Allergies    OBJECTIVE:  Physical Exam  Vitals:   12/27/20 0951  BP: 130/69  Pulse: 84  Weight: 191 lb (86.6 kg)  Height: 5\' 3"  (1.6 m)   Body mass index is 33.83 kg/m. No exam data present  General: well developed, well nourished,  pleasant middle-aged African-American male, seated, in no evident distress Head: head normocephalic and atraumatic.   Neck: supple with no carotid or supraclavicular bruits Cardiovascular: regular rate and rhythm, no murmurs Musculoskeletal: no deformity Skin:  no rash/petichiae Vascular:  Normal pulses all extremities   Neurologic Exam Mental Status: Awake and fully alert.   Fluent speech and language.  Oriented to place and time. Recent and remote memory intact. Attention span, concentration and fund of knowledge appropriate. Mood and affect appropriate.  Cranial Nerves: Pupils equal, briskly reactive to light. Extraocular movements full except left eye uppercase incomplete.  No evidence of  nystagmus.  Visual fields full to confrontation. Hearing intact. Facial sensation intact. Face, tongue, palate moves normally and symmetrically.  Motor: Normal bulk and tone. Normal strength in all tested extremity muscles. Sensory.: intact to touch , pinprick , position and vibratory sensation.  Coordination: Rapid alternating movements normal in all extremities. Finger-to-nose and heel-to-shin performed accurately bilaterally. Gait and Station: Arises from chair without difficulty. Stance is normal. Gait demonstrates normal stride length and balance without use of assistive device.  Mild difficulty performing tandem walk and heel toe.  Romberg negative. Reflexes: 1+ and symmetric. Toes downgoing.        ASSESSMENT: Richard Myers is a 65 y.o. year old male presented with 2 wk hx of double vision on 08/07/2020 with stroke work-up revealing right midbrain infarct secondary to small vessel disease source. Vascular risk factors include HTN, HLD, prior strokes  on imaging (b/l BG and pontine lacunes and R frontal lobe white matter and thalamic hemorrhages) and EtOH use.      PLAN:  1. R midbrain stroke :  a. Recovered well without residual deficit b. Continue clopidogrel 75 mg daily  and atorvastatin 40 mg daily for secondary stroke prevention.  c. Discussed secondary stroke prevention measures and importance of close PCP follow up for aggressive stroke risk factor management  2. HTN: BP goal <130/90.  Stable on amlodipine and lisinopril-hydrochlorothiazide per PCP 3. HLD: LDL goal <70. Recent LDL 83 (11/2020) down from 423 on atorvastatin 40 mg daily 4. Pre-DMII: A1c goal<7.0. Recent A1c 6.2.  Monitored by PCP 5. Suspected sleep apnea: Referral placed to [facility] sleep clinic for further evaluation - no show for initial evaluation with Dr. Frances Furbish -encouraged rescheduling.  Discussed importance of evaluation for possible underlying sleep apnea as untreated sleep apnea increases risk of cardiovascular disease.  He wishes to hold off at this time but is aware to call office in the future if you like to proceed with evaluation    Follow up in 6 months or call earlier if needed   CC:  [facility] provider: Dr. Mosetta Anis, Kinnie Scales, NP     I spent 30 minutes of face-to-face and non-face-to-face time with patient.  This included previsit chart review, lab review, study review, order entry, electronic health record documentation, patient education regarding prior stroke including etiology, importance of managing stroke risk factors and answered all other questions to patient satisfaction  Ihor Austin, [facility]  [facility] [address] [place], [place] [address]  Phone [phone] Fax [phone] Note: This document was prepared with digital dictation and possible smart phrase technology. Any transcriptional errors that result from this process are unintentional.

## 2020-12-27 NOTE — Progress Notes (Signed)
I agree with the above plan 

## 2021-01-11 ENCOUNTER — Telehealth (INDEPENDENT_AMBULATORY_CARE_PROVIDER_SITE_OTHER): Payer: Self-pay

## 2021-01-11 NOTE — Telephone Encounter (Signed)
Patient called to request a refill for  clopidogrel (PLAVIX) 75 MG tablet    Patient uses   [id number] - [place], [place] - [address]    Please advice [phone]

## 2021-01-13 ENCOUNTER — Telehealth: Payer: Self-pay | Admitting: Neurology

## 2021-01-13 ENCOUNTER — Other Ambulatory Visit: Payer: Self-pay | Admitting: Neurology

## 2021-01-13 MED ORDER — CLOPIDOGREL BISULFATE 75 MG PO TABS
75.0000 mg | ORAL_TABLET | Freq: Every day | ORAL | 3 refills | Status: DC
Start: 2021-01-13 — End: 2022-01-23

## 2021-01-13 NOTE — Telephone Encounter (Signed)
Patient is aware to contact neurology for refill. Provided patient with their office number.

## 2021-01-13 NOTE — Telephone Encounter (Signed)
Patient paged me over the weekend for plavix refills. He is also on omeprazole, you may want to discuss with him that this can decrease the efficacy of plavix and see if he can take a different gerd medicine thanks

## 2021-01-16 ENCOUNTER — Other Ambulatory Visit (INDEPENDENT_AMBULATORY_CARE_PROVIDER_SITE_OTHER): Payer: Self-pay | Admitting: Primary Care

## 2021-01-16 DIAGNOSIS — K219 Gastro-esophageal reflux disease without esophagitis: Secondary | ICD-10-CM

## 2021-01-16 MED ORDER — PANTOPRAZOLE SODIUM 40 MG PO TBEC: 40.0000 mg | DELAYED_RELEASE_TABLET | Freq: Every day | ORAL | 3 refills | Status: DC

## 2021-01-16 NOTE — Telephone Encounter (Signed)
Thank you Dr. Lucia Gaskins for refilling -this was discussed at recent visit and he was advised to f/u with PCP for future refills as Plavix will be a life long medication. Request ongoing refills be completed by PCP - change from omeprazole to a different GERD medication such as pantoprazole will be deferred to PCP.   Marcelino Duster, NP - please let me know if you have any concerns regarding the above requests. Thank you!

## 2021-02-21 ENCOUNTER — Telehealth (INDEPENDENT_AMBULATORY_CARE_PROVIDER_SITE_OTHER): Payer: Self-pay

## 2021-02-21 NOTE — Telephone Encounter (Signed)
Patient came into the clinic wanting to speak with PCP. Patient stated it was a Urgent personal matter and would only discuss with PCP. Patient would like a call ASAP.  Please follow up [phone]

## 2021-02-21 NOTE — Telephone Encounter (Signed)
Patient walked in at 4:00 with complaints of a boil near his buttock recurrent- advised to only apply warm compress and s/s of infection warmth, increase pain, red and/or fever go to Urgent care

## 2021-02-22 ENCOUNTER — Other Ambulatory Visit: Payer: Self-pay

## 2021-02-22 ENCOUNTER — Ambulatory Visit: Payer: Medicare HMO | Admitting: Physician Assistant

## 2021-02-22 VITALS — BP 115/72 | HR 89 | Temp 98.2°F | Resp 18 | Ht 63.5 in | Wt 182.0 lb

## 2021-02-22 DIAGNOSIS — L0232 Furuncle of buttock: Secondary | ICD-10-CM

## 2021-02-22 MED ORDER — MUPIROCIN CALCIUM 2 % EX CREA: 1.0000 "application " | TOPICAL_CREAM | Freq: Two times a day (BID) | CUTANEOUS | 0 refills | Status: DC

## 2021-02-22 MED ORDER — SULFAMETHOXAZOLE-TRIMETHOPRIM 800-160 MG PO TABS: 1.0000 | ORAL_TABLET | Freq: Two times a day (BID) | ORAL | 0 refills | Status: AC

## 2021-02-22 NOTE — Progress Notes (Signed)
Established Patient Office Visit  Subjective:  Patient ID: Richard Myers, male    DOB: [birth year]  Age: 65 y.o. MRN: [id number]  CC:  Chief Complaint  Patient presents with  . Cellulitis    HPI Richard Myers reports that he has been having an abscess on his left buttock intermittently for the past year.  Reports that it became inflamed about a week ago, and did drain approximately 2 days ago.  Reports that when it becomes inflamed it is difficult for him to make any type of movements due to the intense pain.  Has not tried anything for relief.    Past Medical History:  Diagnosis Date  . Hyperlipemia   . Hypertension   . Stroke [facility])     History reviewed. No pertinent surgical history.  Family History  Problem Relation Age of Onset  . Arthritis Mother   . Diabetes Mother   . Hypertension Mother   . Alcohol abuse Father     Social History   Socioeconomic History  . Marital status: Single    Spouse name: Not on file  . Number of children: Not on file  . Years of education: Not on file  . Highest education level: Not on file  Occupational History  . Not on file  Tobacco Use  . Smoking status: Never Smoker  . Smokeless tobacco: Never Used  Substance and Sexual Activity  . Alcohol use: Yes    Comment: varies in amount  . Drug use: No  . Sexual activity: Not on file  Other Topics Concern  . Not on file  Social History Narrative  . Not on file   Social Determinants of Health   Financial Resource Strain: Not on file  Food Insecurity: No Food Insecurity  . Worried About Programme researcher, broadcasting/film/video in the Last Year: Never true  . Ran Out of Food in the Last Year: Never true  Transportation Needs: No Transportation Needs  . Lack of Transportation (Medical): No  . Lack of Transportation (Non-Medical): No  Physical Activity: Not on file  Stress: Not on file  Social Connections: Not on file  Intimate Partner Violence: Not on file    Outpatient Medications Prior  to Visit  Medication Sig Dispense Refill  . amLODipine (NORVASC) 10 MG tablet Take 1 tablet (10 mg total) by mouth daily. 90 tablet 3  . atorvastatin (LIPITOR) 40 MG tablet Take 1 tablet (40 mg total) by mouth daily. 90 tablet 1  . clopidogrel (PLAVIX) 75 MG tablet Take 1 tablet (75 mg total) by mouth daily. 90 tablet 3  . lisinopril-hydrochlorothiazide (ZESTORETIC) 20-25 MG tablet Take 1 tablet by mouth daily. 90 tablet 3  . Multiple Vitamin (MULTI-VITAMIN DAILY PO) Take 1 tablet by mouth daily.    . pantoprazole (PROTONIX) 40 MG tablet Take 1 tablet (40 mg total) by mouth daily. 30 tablet 3   No facility-administered medications prior to visit.    No Known Allergies  ROS Review of Systems  Constitutional: Negative for chills and fever.  HENT: Negative.   Eyes: Negative.   Respiratory: Negative for shortness of breath.   Cardiovascular: Negative for chest pain.  Gastrointestinal: Negative.   Endocrine: Negative.   Genitourinary: Negative.   Musculoskeletal: Negative.   Skin: Negative.   Allergic/Immunologic: Negative.   Neurological: Negative.   Hematological: Negative.   Psychiatric/Behavioral: Negative.       Objective:    Physical Exam Vitals and nursing note reviewed.  Constitutional:  Appearance: Normal appearance.  HENT:     Head: Normocephalic and atraumatic.     Right Ear: External ear normal.     Left Ear: External ear normal.     Nose: Nose normal.     Mouth/Throat:     Mouth: Mucous membranes are moist.     Pharynx: Oropharynx is clear.  Eyes:     Extraocular Movements: Extraocular movements intact.     Conjunctiva/sclera: Conjunctivae normal.     Pupils: Pupils are equal, round, and reactive to light.  Cardiovascular:     Rate and Rhythm: Normal rate and regular rhythm.     Pulses: Normal pulses.     Heart sounds: Normal heart sounds.  Pulmonary:     Effort: Pulmonary effort is normal.     Breath sounds: Normal breath sounds.   Musculoskeletal:        General: Normal range of motion.     Cervical back: Normal range of motion and neck supple.  Skin:    General: Skin is warm.       Neurological:     General: No focal deficit present.     Mental Status: He is alert and oriented to person, place, and time.  Psychiatric:        Mood and Affect: Mood normal.        Behavior: Behavior normal.        Thought Content: Thought content normal.        Judgment: Judgment normal.     BP 115/72 (BP Location: Left Arm, Patient Position: Sitting, Cuff Size: Large)   Pulse 89   Temp 98.2 F (36.8 C) (Oral)   Resp 18   Ht 5' 3.5" (1.613 m)   Wt 182 lb (82.6 kg)   SpO2 100%   BMI 31.73 kg/m  Wt Readings from Last 3 Encounters:  02/22/21 182 lb (82.6 kg)  12/27/20 191 lb (86.6 kg)  11/28/20 192 lb 9.6 oz (87.4 kg)     Health Maintenance Due  Topic Date Due  . COVID-19 Vaccine (1) Never done  . Hepatitis C Screening  Never done  . COLONOSCOPY (Pts 45-19yrs Insurance coverage will need to be confirmed)  Never done  . PNA vac Low Risk Adult (1 of 2 - PCV13) Never done    There are no preventive care reminders to display for this patient.  No results found for: TSH Lab Results  Component Value Date   WBC 3.7 08/12/2020   HGB 14.1 08/12/2020   HCT 44.8 08/12/2020   MCV 73 (L) 08/12/2020   PLT 222 08/12/2020   Lab Results  Component Value Date   NA 141 11/14/2020   K 3.8 11/14/2020   CO2 27 11/14/2020   GLUCOSE 112 (H) 11/14/2020   BUN 15 11/14/2020   CREATININE 1.06 11/14/2020   BILITOT 0.7 11/14/2020   ALKPHOS 113 11/14/2020   AST 13 11/14/2020   ALT 13 11/14/2020   PROT 7.6 11/14/2020   ALBUMIN 4.0 11/14/2020   CALCIUM 9.3 11/14/2020   ANIONGAP 9 08/09/2020   Lab Results  Component Value Date   CHOL 158 11/14/2020   Lab Results  Component Value Date   HDL 40 11/14/2020   Lab Results  Component Value Date   LDLCALC 83 11/14/2020   Lab Results  Component Value Date   TRIG 208 (H)  11/14/2020   Lab Results  Component Value Date   CHOLHDL 4.0 11/14/2020   Lab Results  Component Value Date  HGBA1C 6.2 (H) 08/08/2020      Assessment & Plan:   Problem List Items Addressed This Visit   None   Visit Diagnoses    Boil of buttock    -  Primary   Relevant Medications   sulfamethoxazole-trimethoprim (BACTRIM DS) 800-160 MG tablet   mupirocin cream (BACTROBAN) 2 %    1. Boil of buttock Trial Bactrim, Bactroban.  Patient encouraged to keep area clean and dry, patient education given.  Red flags given for prompt reevaluation. - sulfamethoxazole-trimethoprim (BACTRIM DS) 800-160 MG tablet; Take 1 tablet by mouth 2 (two) times daily for 10 days.  Dispense: 20 tablet; Refill: 0 - mupirocin cream (BACTROBAN) 2 %; Apply 1 application topically 2 (two) times daily.  Dispense: 15 g; Refill: 0   I have reviewed the patient's medical history (PMH, PSH, Social History, Family History, Medications, and allergies) , and have been updated if relevant. I spent 25 minutes reviewing chart and  face to face time with patient.      Meds ordered this encounter  Medications  . sulfamethoxazole-trimethoprim (BACTRIM DS) 800-160 MG tablet    Sig: Take 1 tablet by mouth 2 (two) times daily for 10 days.    Dispense:  20 tablet    Refill:  0    Order Specific Question:   Supervising Provider    Answer:   Shan Levans E [[id number]]  . mupirocin cream (BACTROBAN) 2 %    Sig: Apply 1 application topically 2 (two) times daily.    Dispense:  15 g    Refill:  0    Order Specific Question:   Supervising Provider    Answer:   Storm Frisk [[id number]]    Follow-up: Return if symptoms worsen or fail to improve.    Kasandra Knudsen Mayers, PA-C

## 2021-02-22 NOTE — Progress Notes (Signed)
Patient complains of a boil being present for the past year. Patient reports the area burst and then returns. Patient report in the left inner thigh. Patient has eaten and taken medication today Patient shares the last boil burst 2 days ago.

## 2021-02-22 NOTE — Patient Instructions (Signed)
You will take Bactrim twice a day for the next 10 days, you also apply the antibiotic cream to the boil twice a day.  Make sure you keep the area clean and dry.  Please let us know if there is anything else we can do for you.  Roney Jaffe, PA-C Physician Assistant [facility] [email or web address]    Skin Abscess  A skin abscess is an infected area on or under your skin that contains a collection of pus and other material. An abscess may also be called a furuncle, carbuncle, or boil. An abscess can occur in or on almost any part of your body. Some abscesses break open (rupture) on their own. Most continue to get worse unless they are treated. The infection can spread deeper into the body and eventually into your blood, which can make you feel ill. Treatment usually involves draining the abscess. What are the causes? An abscess occurs when germs, like bacteria, pass through your skin and cause an infection. This may be caused by:  A scrape or cut on your skin.  A puncture wound through your skin, including a needle injection or insect bite.  Blocked oil or sweat glands.  Blocked and infected hair follicles.  A cyst that forms beneath your skin (sebaceous cyst) and becomes infected. What increases the risk? This condition is more likely to develop in people who:  Have a weak body defense system (immune system).  Have diabetes.  Have dry and irritated skin.  Get frequent injections or use illegal IV drugs.  Have a foreign body in a wound, such as a splinter.  Have problems with their lymph system or veins. What are the signs or symptoms? Symptoms of this condition include:  A painful, firm bump under the skin.  A bump with pus at the top. This may break through the skin and drain. Other symptoms include:  Redness surrounding the abscess site.  Warmth.  Swelling of the lymph nodes (glands) near the  abscess.  Tenderness.  A sore on the skin. How is this diagnosed? This condition may be diagnosed based on:  A physical exam.  Your medical history.  A sample of pus. This may be used to find out what is causing the infection.  Blood tests.  Imaging tests, such as an ultrasound, CT scan, or MRI. How is this treated? A small abscess that drains on its own may not need treatment. Treatment for larger abscesses may include:  Moist heat or heat pack applied to the area several times a day.  A procedure to drain the abscess (incision and drainage).  Antibiotic medicines. For a severe abscess, you may first get antibiotics through an IV and then change to antibiotics by mouth. Follow these instructions at home: Medicines  Take over-the-counter and prescription medicines only as told by your health care provider.  If you were prescribed an antibiotic medicine, take it as told by your health care provider. Do not stop taking the antibiotic even if you start to feel better.   Abscess care  If you have an abscess that has not drained, apply heat to the affected area. Use the heat source that your health care provider recommends, such as a moist heat pack or a heating pad. ? Place a towel between your skin and the heat source. ? Leave the heat on for 20-30 minutes. ? Remove the heat if your skin turns bright red. This is especially important if you are unable to feel pain,  heat, or cold. You may have a greater risk of getting burned.  Follow instructions from your health care provider about how to take care of your abscess. Make sure you: ? Cover the abscess with a bandage (dressing). ? Change your dressing or gauze as told by your health care provider. ? Wash your hands with soap and water before you change the dressing or gauze. If soap and water are not available, use hand sanitizer.  Check your abscess every day for signs of a worsening infection. Check for: ? More redness,  swelling, or pain. ? More fluid or blood. ? Warmth. ? More pus or a bad smell.   General instructions  To avoid spreading the infection: ? Do not share personal care items, towels, or hot tubs with others. ? Avoid making skin contact with other people.  Keep all follow-up visits as told by your health care provider. This is important. Contact a health care provider if you have:  More redness, swelling, or pain around your abscess.  More fluid or blood coming from your abscess.  Warm skin around your abscess.  More pus or a bad smell coming from your abscess.  A fever.  Muscle aches.  Chills or a general ill feeling. Get help right away if you:  Have severe pain.  See red streaks on your skin spreading away from the abscess. Summary  A skin abscess is an infected area on or under your skin that contains a collection of pus and other material.  A small abscess that drains on its own may not need treatment.  Treatment for larger abscesses may include having a procedure to drain the abscess and taking an antibiotic. This information is not intended to replace advice given to you by your health care provider. Make sure you discuss any questions you have with your health care provider. Document Revised: 01/15/2019 Document Reviewed: 11/07/2017 Elsevier Patient Education  2021 [employer].

## 2021-02-23 DIAGNOSIS — L0232 Furuncle of buttock: Secondary | ICD-10-CM | POA: Insufficient documentation

## 2021-02-27 ENCOUNTER — Ambulatory Visit (INDEPENDENT_AMBULATORY_CARE_PROVIDER_SITE_OTHER): Payer: Medicare Other | Admitting: Family Medicine

## 2021-03-02 ENCOUNTER — Other Ambulatory Visit (INDEPENDENT_AMBULATORY_CARE_PROVIDER_SITE_OTHER): Payer: Self-pay | Admitting: Primary Care

## 2021-03-02 NOTE — Telephone Encounter (Signed)
Requested Prescriptions  Pending Prescriptions Disp Refills  . atorvastatin (LIPITOR) 40 MG tablet [Pharmacy Med Name: ATORVASTATIN 40 MG TABLET] 90 tablet 1    Sig: TAKE 1 TABLET BY MOUTH EVERY DAY     Cardiovascular:  Antilipid - Statins Failed - 03/02/2021 11:57 AM      Failed - Triglycerides in normal range and within 360 days    Triglycerides  Date Value Ref Range Status  11/14/2020 208 (H) 0 - 149 mg/dL Final         Passed - Total Cholesterol in normal range and within 360 days    Cholesterol, Total  Date Value Ref Range Status  11/14/2020 158 100 - 199 mg/dL Final         Passed - LDL in normal range and within 360 days    LDL Chol Calc (NIH)  Date Value Ref Range Status  11/14/2020 83 0 - 99 mg/dL Final         Passed - HDL in normal range and within 360 days    HDL  Date Value Ref Range Status  11/14/2020 40 >39 mg/dL Final         Passed - Patient is not pregnant      Passed - Valid encounter within last 12 months    Recent Outpatient Visits          3 months ago Essential hypertension   [facility] Grayce Sessions, NP   3 months ago Need for Tdap vaccination   [facility] Grayce Sessions, NP   6 months ago Essential hypertension   [facility] Grayce Sessions, NP      Future Appointments            In 1 week Randa Evens Kinnie Scales, NP [facility]

## 2021-03-14 ENCOUNTER — Other Ambulatory Visit: Payer: Self-pay

## 2021-03-14 ENCOUNTER — Ambulatory Visit (INDEPENDENT_AMBULATORY_CARE_PROVIDER_SITE_OTHER): Payer: Medicare (Managed Care) | Admitting: Primary Care

## 2021-03-14 ENCOUNTER — Encounter (INDEPENDENT_AMBULATORY_CARE_PROVIDER_SITE_OTHER): Payer: Self-pay | Admitting: Primary Care

## 2021-03-14 VITALS — BP 126/84 | HR 82 | Temp 97.5°F | Ht 63.0 in | Wt 194.6 lb

## 2021-03-14 DIAGNOSIS — L0232 Furuncle of buttock: Secondary | ICD-10-CM | POA: Diagnosis not present

## 2021-03-14 DIAGNOSIS — Z23 Encounter for immunization: Secondary | ICD-10-CM

## 2021-03-14 DIAGNOSIS — K219 Gastro-esophageal reflux disease without esophagitis: Secondary | ICD-10-CM

## 2021-03-14 DIAGNOSIS — R5383 Other fatigue: Secondary | ICD-10-CM

## 2021-03-14 DIAGNOSIS — I10 Essential (primary) hypertension: Secondary | ICD-10-CM

## 2021-03-14 DIAGNOSIS — Z760 Encounter for issue of repeat prescription: Secondary | ICD-10-CM

## 2021-03-14 DIAGNOSIS — R7303 Prediabetes: Secondary | ICD-10-CM | POA: Diagnosis not present

## 2021-03-14 DIAGNOSIS — Z125 Encounter for screening for malignant neoplasm of prostate: Secondary | ICD-10-CM

## 2021-03-14 LAB — POCT GLYCOSYLATED HEMOGLOBIN (HGB A1C): Hemoglobin A1C: 5.9 % — AB (ref 4.0–5.6)

## 2021-03-14 MED ORDER — AMLODIPINE BESYLATE 10 MG PO TABS: 10.0000 mg | ORAL_TABLET | Freq: Every day | ORAL | 3 refills | Status: DC

## 2021-03-14 MED ORDER — LISINOPRIL-HYDROCHLOROTHIAZIDE 20-25 MG PO TABS: 1.0000 | ORAL_TABLET | Freq: Every day | ORAL | 3 refills | Status: DC

## 2021-03-14 MED ORDER — PANTOPRAZOLE SODIUM 40 MG PO TBEC: 40.0000 mg | DELAYED_RELEASE_TABLET | Freq: Every day | ORAL | 3 refills | Status: DC

## 2021-03-14 NOTE — Patient Instructions (Signed)
Food Choices for Gastroesophageal Reflux Disease, Adult When you have gastroesophageal reflux disease (GERD), the foods you eat and your eating habits are very important. Choosing the right foods can help ease your discomfort. Think about working with a food expert (dietitian) to help you make good choices. What are tips for following this plan? Reading food labels  Look for foods that are low in saturated fat. Foods that may help with your symptoms include: ? Foods that have less than 5% of daily value (DV) of fat. ? Foods that have 0 grams of trans fat. Cooking  Do not fry your food.  Cook your food by baking, steaming, grilling, or broiling. These are all methods that do not need a lot of fat for cooking.  To add flavor, try to use herbs that are low in spice and acidity. Meal planning  Choose healthy foods that are low in fat, such as: ? Fruits and vegetables. ? Whole grains. ? Low-fat dairy products. ? Lean meats, fish, and poultry.  Eat small meals often instead of eating 3 large meals each day. Eat your meals slowly in a place where you are relaxed. Avoid bending over or lying down until 2-3 hours after eating.  Limit high-fat foods such as fatty meats or fried foods.  Limit your intake of fatty foods, such as oils, butter, and shortening.  Avoid the following as told by your doctor: ? Foods that cause symptoms. These may be different for different people. Keep a food diary to keep track of foods that cause symptoms. ? Alcohol. ? Drinking a lot of liquid with meals. ? Eating meals during the 2-3 hours before bed.   Lifestyle  Stay at a healthy weight. Ask your doctor what weight is healthy for you. If you need to lose weight, work with your doctor to do so safely.  Exercise for at least 30 minutes on 5 or more days each week, or as told by your doctor.  Wear loose-fitting clothes.  Do not smoke or use any products that contain nicotine or tobacco. If you need help  quitting, ask your doctor.  Sleep with the head of your bed higher than your feet. Use a wedge under the mattress or blocks under the bed frame to raise the head of the bed.  Chew sugar-free gum after meals. What foods should eat? Eat a healthy, well-balanced diet of fruits, vegetables, whole grains, low-fat dairy products, lean meats, fish, and poultry. Each person is different. Foods that may cause symptoms in one person may not cause any symptoms in another person. Work with your doctor to find foods that are safe for you. The items listed above may not be a complete list of what you can eat and drink. Contact a food expert for more options.   What foods should I avoid? Limiting some of these foods may help in managing the symptoms of GERD. Everyone is different. Talk with a food expert or your doctor to help you find the exact foods to avoid, if any. Fruits Any fruits prepared with added fat. Any fruits that cause symptoms. For some people, this may include citrus fruits, such as oranges, grapefruit, pineapple, and lemons. Vegetables Deep-fried vegetables. French fries. Any vegetables prepared with added fat. Any vegetables that cause symptoms. For some people, this may include tomatoes and tomato products, chili peppers, onions and garlic, and horseradish. Grains Pastries or quick breads with added fat. Meats and other proteins High-fat meats, such as fatty beef or pork,   hot dogs, ribs, ham, sausage, salami, and bacon. Fried meat or protein, including fried fish and fried chicken. Nuts and nut butters, in large amounts. Dairy Whole milk and chocolate milk. Sour cream. Cream. Ice cream. Cream cheese. Milkshakes. Fats and oils Butter. Margarine. Shortening. Ghee. Beverages Coffee and tea, with or without caffeine. Carbonated beverages. Sodas. Energy drinks. Fruit juice made with acidic fruits, such as orange or grapefruit. Tomato juice. Alcoholic drinks. Sweets and desserts Chocolate and  cocoa. Donuts. Seasonings and condiments Pepper. Peppermint and spearmint. Added salt. Any condiments, herbs, or seasonings that cause symptoms. For some people, this may include curry, hot sauce, or vinegar-based salad dressings. The items listed above may not be a complete list of what you should not eat and drink. Contact a food expert for more options. Questions to ask your doctor Diet and lifestyle changes are often the first steps that are taken to manage symptoms of GERD. If diet and lifestyle changes do not help, talk with your doctor about taking medicines. Where to find more information  International Foundation for Gastrointestinal Disorders: aboutgerd.org Summary  When you have GERD, food and lifestyle choices are very important in easing your symptoms.  Eat small meals often instead of 3 large meals a day. Eat your meals slowly and in a place where you are relaxed.  Avoid bending over or lying down until 2-3 hours after eating.  Limit high-fat foods such as fatty meats or fried foods. This information is not intended to replace advice given to you by your health care provider. Make sure you discuss any questions you have with your health care provider. Document Revised: 04/04/2020 Document Reviewed: 04/04/2020 Elsevier Patient Education  2021 Elsevier Inc.  

## 2021-03-14 NOTE — Progress Notes (Signed)
Acute Office Visit  Subjective:    Patient ID: Richard Myers, male    DOB: [birth year], 65 y.o.   MRN: [id number]  Chief Complaint  Patient presents with  . Hypertension    HPI [name] is a 65 year old obese male who presents today with a recurrent fall on his buttocks exact location is his left cheek.  He has recently been treated with antibiotics and cream however neither work.  Today I explained to him if the boil/nodule/sebaceous cyst is recurrent we will need to refer him to general surgery.  Patient is in agreement.  Blood pressure today is unremarkable 126/84 Denies shortness of breath, headaches, chest pain or lower extremity edema   Past Medical History:  Diagnosis Date  . Hyperlipemia   . Hypertension   . Stroke [facility])     No past surgical history on file.  Family History  Problem Relation Age of Onset  . Arthritis Mother   . Diabetes Mother   . Hypertension Mother   . Alcohol abuse Father     Social History   Socioeconomic History  . Marital status: Single    Spouse name: Not on file  . Number of children: Not on file  . Years of education: Not on file  . Highest education level: Not on file  Occupational History  . Not on file  Tobacco Use  . Smoking status: Never Smoker  . Smokeless tobacco: Never Used  Substance and Sexual Activity  . Alcohol use: Yes    Comment: varies in amount  . Drug use: No  . Sexual activity: Not on file  Other Topics Concern  . Not on file  Social History Narrative  . Not on file   Social Determinants of Health   Financial Resource Strain: Not on file  Food Insecurity: No Food Insecurity  . Worried About Programme researcher, broadcasting/film/video in the Last Year: Never true  . Ran Out of Food in the Last Year: Never true  Transportation Needs: No Transportation Needs  . Lack of Transportation (Medical): No  . Lack of Transportation (Non-Medical): No  Physical Activity: Not on file  Stress: Not on file  Social  Connections: Not on file  Intimate Partner Violence: Not on file    Outpatient Medications Prior to Visit  Medication Sig Dispense Refill  . atorvastatin (LIPITOR) 40 MG tablet TAKE 1 TABLET BY MOUTH EVERY DAY 90 tablet 1  . clopidogrel (PLAVIX) 75 MG tablet Take 1 tablet (75 mg total) by mouth daily. 90 tablet 3  . Multiple Vitamin (MULTI-VITAMIN DAILY PO) Take 1 tablet by mouth daily.    . mupirocin cream (BACTROBAN) 2 % Apply 1 application topically 2 (two) times daily. 15 g 0  . amLODipine (NORVASC) 10 MG tablet Take 1 tablet (10 mg total) by mouth daily. 90 tablet 3  . lisinopril-hydrochlorothiazide (ZESTORETIC) 20-25 MG tablet Take 1 tablet by mouth daily. 90 tablet 3  . pantoprazole (PROTONIX) 40 MG tablet Take 1 tablet (40 mg total) by mouth daily. 30 tablet 3   No facility-administered medications prior to visit.    No Known Allergies  Review of Systems  Constitutional: Positive for fatigue.  Skin:       boil/nodule/sebaceous cyst  All other systems reviewed and are negative.      Objective:    Physical Exam Vitals reviewed.  Constitutional:      Appearance: He is obese.  HENT:     Head: Normocephalic.  Right Ear: Tympanic membrane normal.     Left Ear: Tympanic membrane normal.     Nose: Nose normal.  Eyes:     Extraocular Movements: Extraocular movements intact.     Pupils: Pupils are equal, round, and reactive to light.  Cardiovascular:     Rate and Rhythm: Normal rate and regular rhythm.  Pulmonary:     Effort: Pulmonary effort is normal.     Breath sounds: Normal breath sounds.  Abdominal:     General: Bowel sounds are normal. There is distension.     Palpations: Abdomen is soft.  Musculoskeletal:        General: Normal range of motion.     Cervical back: Normal range of motion.  Skin:    Comments: boil/nodule/sebaceous cyst  Neurological:     Mental Status: He is alert and oriented to person, place, and time.  Psychiatric:        Mood and  Affect: Mood normal.        Behavior: Behavior normal.        Thought Content: Thought content normal.        Judgment: Judgment normal.     BP 126/84 (BP Location: Right Arm, Patient Position: Sitting, Cuff Size: Normal)   Pulse 82   Temp (!) 97.5 F (36.4 C) (Temporal)   Ht 5\' 3"  (1.6 m)   Wt 194 lb 9.6 oz (88.3 kg)   SpO2 99%   BMI 34.47 kg/m  Wt Readings from Last 3 Encounters:  03/14/21 194 lb 9.6 oz (88.3 kg)  02/22/21 182 lb (82.6 kg)  12/27/20 191 lb (86.6 kg)    Health Maintenance Due  Topic Date Due  . Hepatitis C Screening  Never done  . COLONOSCOPY (Pts 45-37yrs Insurance coverage will need to be confirmed)  Never done  . Zoster Vaccines- Shingrix (1 of 2) Never done    There are no preventive care reminders to display for this patient.   No results found for: TSH Lab Results  Component Value Date   WBC 3.7 08/12/2020   HGB 14.1 08/12/2020   HCT 44.8 08/12/2020   MCV 73 (L) 08/12/2020   PLT 222 08/12/2020   Lab Results  Component Value Date   NA 141 11/14/2020   K 3.8 11/14/2020   CO2 27 11/14/2020   GLUCOSE 112 (H) 11/14/2020   BUN 15 11/14/2020   CREATININE 1.06 11/14/2020   BILITOT 0.7 11/14/2020   ALKPHOS 113 11/14/2020   AST 13 11/14/2020   ALT 13 11/14/2020   PROT 7.6 11/14/2020   ALBUMIN 4.0 11/14/2020   CALCIUM 9.3 11/14/2020   ANIONGAP 9 08/09/2020   Lab Results  Component Value Date   CHOL 158 11/14/2020   Lab Results  Component Value Date   HDL 40 11/14/2020   Lab Results  Component Value Date   LDLCALC 83 11/14/2020   Lab Results  Component Value Date   TRIG 208 (H) 11/14/2020   Lab Results  Component Value Date   CHOLHDL 4.0 11/14/2020   Lab Results  Component Value Date   HGBA1C 5.9 (A) 03/14/2021       Assessment & Plan:  Richard Myers was seen today for hypertension.  Diagnoses and all orders for this visit:  Prediabetes -     HgB A1c 5.9.  Prediabetic from 5.7-6.4.  Treatment plan is diet modification  reducing rice, potatoes, breads, sweets, grits and soda .  Need for prophylactic vaccination against Streptococcus pneumoniae (pneumococcus) -  Pneumococcal polysaccharide vaccine 23-valent greater than or equal to 2yo subcutaneous/IM  Boil of buttock boil/nodule/sebaceous cyst -     Ambulatory referral to General Surgery  Essential hypertension Blood pressure is at goal of less than 130/80, low-sodium, DASH diet, medication compliance, 150 minutes of moderate intensity exercise per week. Discussed medication compliance, adverse effects.  Continue amlodipine 10 mg daily and lisinopril/HCTZ 20/25 daily.  Medication refill -     amLODipine (NORVASC) 10 MG tablet; Take 1 tablet (10 mg total) by mouth daily. -     lisinopril-hydrochlorothiazide (ZESTORETIC) 20-25 MG tablet; Take 1 tablet by mouth daily. -     pantoprazole (PROTONIX) 40 MG tablet; Take 1 tablet (40 mg total) by mouth daily.  Gastroesophageal reflux disease, unspecified whether esophagitis present -     pantoprazole (PROTONIX) 40 MG tablet; Take 1 tablet (40 mg total) by mouth daily. Discussed eating small frequent meal, reduction in acidic foods, fried foods ,spicy foods, alcohol caffeine and tobacco and certain medications. Avoid laying down after eating 62mins-1hour, elevated head of the bed.  Fatigue, unspecified type No energy takes a energy just to sweep the floor, denies depression- enjoys grandchildren, reading Bible and going place but weak/tired   -     TSH + free T4 -     CBC with Differential -     B12 and Folate Panel -     Vitamin D, 25-hydroxy  Prostate cancer screening -     PSA    Meds ordered this encounter  Medications  . amLODipine (NORVASC) 10 MG tablet    Sig: Take 1 tablet (10 mg total) by mouth daily.    Dispense:  90 tablet    Refill:  3  . lisinopril-hydrochlorothiazide (ZESTORETIC) 20-25 MG tablet    Sig: Take 1 tablet by mouth daily.    Dispense:  90 tablet    Refill:  3  .  pantoprazole (PROTONIX) 40 MG tablet    Sig: Take 1 tablet (40 mg total) by mouth daily.    Dispense:  30 tablet    Refill:  3     Grayce Sessions, NP

## 2021-03-15 ENCOUNTER — Other Ambulatory Visit (INDEPENDENT_AMBULATORY_CARE_PROVIDER_SITE_OTHER): Payer: Medicare HMO

## 2021-03-15 LAB — TSH+FREE T4
Free T4: 1.06 ng/dL (ref 0.82–1.77)
TSH: 1.12 u[IU]/mL (ref 0.450–4.500)

## 2021-03-15 LAB — CBC WITH DIFFERENTIAL/PLATELET
Basophils Absolute: 0.1 10*3/uL (ref 0.0–0.2)
Basos: 1 %
EOS (ABSOLUTE): 0.3 10*3/uL (ref 0.0–0.4)
Eos: 6 %
Hematocrit: 39.7 % (ref 37.5–51.0)
Hemoglobin: 12.5 g/dL — ABNORMAL LOW (ref 13.0–17.7)
Immature Grans (Abs): 0 10*3/uL (ref 0.0–0.1)
Immature Granulocytes: 0 %
Lymphocytes Absolute: 2.4 10*3/uL (ref 0.7–3.1)
Lymphs: 42 %
MCH: 23.2 pg — ABNORMAL LOW (ref 26.6–33.0)
MCHC: 31.5 g/dL (ref 31.5–35.7)
MCV: 74 fL — ABNORMAL LOW (ref 79–97)
Monocytes Absolute: 0.4 10*3/uL (ref 0.1–0.9)
Monocytes: 7 %
Neutrophils Absolute: 2.6 10*3/uL (ref 1.4–7.0)
Neutrophils: 44 %
Platelets: 240 10*3/uL (ref 150–450)
RBC: 5.39 x10E6/uL (ref 4.14–5.80)
RDW: 16.6 % — ABNORMAL HIGH (ref 11.6–15.4)
WBC: 5.7 10*3/uL (ref 3.4–10.8)

## 2021-03-15 LAB — PSA: Prostate Specific Ag, Serum: 1.3 ng/mL (ref 0.0–4.0)

## 2021-03-15 LAB — B12 AND FOLATE PANEL
Folate: 12.1 ng/mL (ref >3.0)
Vitamin B-12: 1283 pg/mL — ABNORMAL HIGH (ref 232–1245)

## 2021-03-15 LAB — VITAMIN D 25 HYDROXY (VIT D DEFICIENCY, FRACTURES): Vit D, 25-Hydroxy: 29.4 ng/mL — ABNORMAL LOW (ref 30.0–100.0)

## 2021-03-16 ENCOUNTER — Other Ambulatory Visit (INDEPENDENT_AMBULATORY_CARE_PROVIDER_SITE_OTHER): Payer: Medicare HMO

## 2021-03-20 ENCOUNTER — Other Ambulatory Visit (INDEPENDENT_AMBULATORY_CARE_PROVIDER_SITE_OTHER): Payer: Self-pay | Admitting: Primary Care

## 2021-03-20 DIAGNOSIS — E559 Vitamin D deficiency, unspecified: Secondary | ICD-10-CM

## 2021-03-20 MED ORDER — VITAMIN D3 50 MCG (2000 UT) PO CAPS: 2000.0000 [IU] | ORAL_CAPSULE | Freq: Every day | ORAL | 1 refills | Status: DC

## 2021-03-24 NOTE — Progress Notes (Signed)
DPR signed.  Voicemail not set up. Unable to leave message.

## 2021-03-24 NOTE — Progress Notes (Signed)
Patient DPR signed.  Unable to leave voicemail.

## 2021-03-24 NOTE — Progress Notes (Signed)
Unable to reach. Voicemail is not set up .  

## 2021-04-11 ENCOUNTER — Ambulatory Visit (INDEPENDENT_AMBULATORY_CARE_PROVIDER_SITE_OTHER): Payer: Medicare HMO | Admitting: Primary Care

## 2021-05-11 ENCOUNTER — Emergency Department (HOSPITAL_COMMUNITY): Payer: Medicare HMO

## 2021-05-11 ENCOUNTER — Emergency Department (HOSPITAL_COMMUNITY)
Admission: EM | Admit: 2021-05-11 | Discharge: 2021-05-11 | Disposition: A | Discharge status: Home / Self Care | Payer: Medicare HMO | Attending: Emergency Medicine | Admitting: Emergency Medicine

## 2021-05-11 ENCOUNTER — Encounter (HOSPITAL_COMMUNITY): Payer: Self-pay

## 2021-05-11 DIAGNOSIS — Z79899 Other long term (current) drug therapy: Secondary | ICD-10-CM | POA: Diagnosis not present

## 2021-05-11 DIAGNOSIS — S80211A Abrasion, right knee, initial encounter: Secondary | ICD-10-CM | POA: Diagnosis not present

## 2021-05-11 DIAGNOSIS — Y92410 Unspecified street and highway as the place of occurrence of the external cause: Secondary | ICD-10-CM | POA: Diagnosis not present

## 2021-05-11 DIAGNOSIS — S20214A Contusion of middle front wall of thorax, initial encounter: Secondary | ICD-10-CM | POA: Diagnosis not present

## 2021-05-11 DIAGNOSIS — I10 Essential (primary) hypertension: Secondary | ICD-10-CM | POA: Insufficient documentation

## 2021-05-11 DIAGNOSIS — S80212A Abrasion, left knee, initial encounter: Secondary | ICD-10-CM | POA: Insufficient documentation

## 2021-05-11 DIAGNOSIS — S299XXA Unspecified injury of thorax, initial encounter: Secondary | ICD-10-CM | POA: Diagnosis present

## 2021-05-11 LAB — I-STAT CHEM 8, ED
BUN: 14 mg/dL (ref 8–23)
Calcium, Ion: 0.98 mmol/L — ABNORMAL LOW (ref 1.15–1.40)
Chloride: 104 mmol/L (ref 98–111)
Creatinine, Ser: 0.8 mg/dL (ref 0.61–1.24)
Glucose, Bld: 94 mg/dL (ref 70–99)
HCT: 39 % (ref 39.0–52.0)
Hemoglobin: 13.3 g/dL (ref 13.0–17.0)
Potassium: 4 mmol/L (ref 3.5–5.1)
Sodium: 138 mmol/L (ref 135–145)
TCO2: 29 mmol/L (ref 22–32)

## 2021-05-11 MED ORDER — IOHEXOL 300 MG/ML  SOLN
50.0000 mL | Freq: Once | INTRAMUSCULAR | Status: AC | PRN
  Administered 2021-05-11: 50 mL via INTRAVENOUS

## 2021-05-11 MED ORDER — METHOCARBAMOL 500 MG PO TABS: 500.0000 mg | ORAL_TABLET | Freq: Two times a day (BID) | ORAL | 0 refills | Status: DC

## 2021-05-11 MED ORDER — ACETAMINOPHEN 325 MG PO TABS
650.0000 mg | ORAL_TABLET | Freq: Once | ORAL | Status: DC
  Filled 2021-05-11: qty 2

## 2021-05-11 MED ORDER — FENTANYL CITRATE (PF) 100 MCG/2ML IJ SOLN
25.0000 ug | Freq: Once | INTRAMUSCULAR | Status: AC
  Administered 2021-05-11: 25 ug via INTRAVENOUS
  Filled 2021-05-11: qty 2
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
## 2021-06-12 ENCOUNTER — Telehealth (INDEPENDENT_AMBULATORY_CARE_PROVIDER_SITE_OTHER): Payer: Self-pay

## 2021-06-12 NOTE — Telephone Encounter (Signed)
Left Vm to call me back and schedule AWV by phone.

## 2021-06-14 ENCOUNTER — Ambulatory Visit (INDEPENDENT_AMBULATORY_CARE_PROVIDER_SITE_OTHER): Payer: Medicare HMO | Admitting: Primary Care

## 2021-06-14 ENCOUNTER — Other Ambulatory Visit: Payer: Self-pay

## 2021-06-14 ENCOUNTER — Encounter (INDEPENDENT_AMBULATORY_CARE_PROVIDER_SITE_OTHER): Payer: Self-pay | Admitting: Primary Care

## 2021-06-14 VITALS — BP 108/72 | HR 93 | Temp 97.5°F | Ht 63.0 in | Wt 188.4 lb

## 2021-06-14 DIAGNOSIS — Z23 Encounter for immunization: Secondary | ICD-10-CM

## 2021-06-14 DIAGNOSIS — E785 Hyperlipidemia, unspecified: Secondary | ICD-10-CM | POA: Diagnosis not present

## 2021-06-14 DIAGNOSIS — I10 Essential (primary) hypertension: Secondary | ICD-10-CM | POA: Diagnosis not present

## 2021-06-14 DIAGNOSIS — D509 Iron deficiency anemia, unspecified: Secondary | ICD-10-CM

## 2021-06-14 DIAGNOSIS — L0232 Furuncle of buttock: Secondary | ICD-10-CM

## 2021-06-14 DIAGNOSIS — N529 Male erectile dysfunction, unspecified: Secondary | ICD-10-CM

## 2021-06-14 MED ORDER — TADALAFIL 5 MG PO TABS: 5.0000 mg | ORAL_TABLET | Freq: Every day | ORAL | 3 refills | Status: DC

## 2021-06-14 NOTE — Patient Instructions (Signed)
Vitamin D is needed to make and keep bones strong. The patient will need to take a prescription strength vitamin D tablet once weekly until next appointment.  Vitamin D level will be rechecked at future visit.  I have sent the vitamin D tablet to the pharmacy and it should be ready for pick up.  Please remind patient of upcoming appointments and/or schedule for follow-up if needed in 6-8 weeks.
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
## 2021-06-15 LAB — LIPID PANEL
Chol/HDL Ratio: 3.5 ratio (ref 0.0–5.0)
Cholesterol, Total: 140 mg/dL (ref 100–199)
HDL: 40 mg/dL (ref >39)
LDL Chol Calc (NIH): 81 mg/dL (ref 0–99)
Triglycerides: 102 mg/dL (ref 0–149)
VLDL Cholesterol Cal: 19 mg/dL (ref 5–40)

## 2021-06-15 LAB — CBC WITH DIFFERENTIAL/PLATELET
Basophils Absolute: 0 10*3/uL (ref 0.0–0.2)
Basos: 1 %
EOS (ABSOLUTE): 0.3 10*3/uL (ref 0.0–0.4)
Eos: 7 %
Hematocrit: 40.5 % (ref 37.5–51.0)
Hemoglobin: 13.1 g/dL (ref 13.0–17.7)
Immature Grans (Abs): 0 10*3/uL (ref 0.0–0.1)
Immature Granulocytes: 0 %
Lymphocytes Absolute: 1.6 10*3/uL (ref 0.7–3.1)
Lymphs: 41 %
MCH: 23.3 pg — ABNORMAL LOW (ref 26.6–33.0)
MCHC: 32.3 g/dL (ref 31.5–35.7)
MCV: 72 fL — ABNORMAL LOW (ref 79–97)
Monocytes Absolute: 0.3 10*3/uL (ref 0.1–0.9)
Monocytes: 8 %
Neutrophils Absolute: 1.6 10*3/uL (ref 1.4–7.0)
Neutrophils: 43 %
Platelets: 218 10*3/uL (ref 150–450)
RBC: 5.63 x10E6/uL (ref 4.14–5.80)
RDW: 17.2 % — ABNORMAL HIGH (ref 11.6–15.4)
WBC: 3.8 10*3/uL (ref 3.4–10.8)

## 2021-06-15 LAB — CMP14+EGFR
ALT: 10 IU/L (ref 0–44)
AST: 12 IU/L (ref 0–40)
Albumin/Globulin Ratio: 1.2 (ref 1.2–2.2)
Albumin: 4.1 g/dL (ref 3.8–4.8)
Alkaline Phosphatase: 99 IU/L (ref 44–121)
BUN/Creatinine Ratio: 11 (ref 10–24)
BUN: 13 mg/dL (ref 8–27)
Bilirubin Total: 0.5 mg/dL (ref 0.0–1.2)
CO2: 23 mmol/L (ref 20–29)
Calcium: 9.2 mg/dL (ref 8.6–10.2)
Chloride: 99 mmol/L (ref 96–106)
Creatinine, Ser: 1.14 mg/dL (ref 0.76–1.27)
Globulin, Total: 3.5 g/dL (ref 1.5–4.5)
Glucose: 141 mg/dL — ABNORMAL HIGH (ref 65–99)
Potassium: 3.9 mmol/L (ref 3.5–5.2)
Sodium: 138 mmol/L (ref 134–144)
Total Protein: 7.6 g/dL (ref 6.0–8.5)
eGFR: 71 mL/min/{1.73_m2} (ref >59)

## 2021-06-15 LAB — VITAMIN B12: Vitamin B-12: 1124 pg/mL (ref 232–1245)

## 2021-06-15 LAB — FOLATE: Folate: 11 ng/mL (ref >3.0)

## 2021-06-22 ENCOUNTER — Telehealth (INDEPENDENT_AMBULATORY_CARE_PROVIDER_SITE_OTHER): Payer: Self-pay

## 2021-06-22 NOTE — Telephone Encounter (Signed)
Spoke with patient. Date of birth verified. He is aware of normal labs along with dietary and exercise advise per PCP. He verbalized understanding. Maryjean Morn, CMA

## 2021-06-22 NOTE — Telephone Encounter (Signed)
-----   Message from Grayce Sessions, NP sent at 06/18/2021 11:18 PM EDT ----- Labs are  normal. Make sure you are drinking at least 48 oz of water per day. Work on eating a low fat, heart healthy diet and participate in regular aerobic exercise program to control as well. Exercise at least  30 minutes per day-5 days per week. Avoid red meat. No fried foods. No junk foods, sodas, sugary foods or drinks, unhealthy snacking, alcohol or smoking.

## 2021-07-03 ENCOUNTER — Ambulatory Visit: Payer: Medicare Other | Admitting: Adult Health

## 2021-07-03 ENCOUNTER — Encounter: Payer: Self-pay | Admitting: Adult Health

## 2021-07-03 NOTE — Progress Notes (Deleted)
[facility] [address] [place]. [place] [address] [phone]       STROKE FOLLOW UP NOTE  Mr. Chrisean Kloth Date of Birth:  December 22, 1955 Medical Record Number:  [id number]   Reason for Referral: stroke follow up    SUBJECTIVE:   CHIEF COMPLAINT:  No chief complaint on file.   HPI:   Tyeler Goedken is a 65 y.o. 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  Overall stable.  Denies new or reoccurring stroke/TIA symptoms.  Compliant on Plavix and atorvastatin without side effects.  Blood pressure today ***.       History provided for reference purposes only Update 12/27/2020 JM Ms. Troop returns for stroke follow-up after prior visit approximately 4 months ago.  Doing well since prior visit - reports complete recovery since prior visit Denies new stroke/TIA symptoms  Compliant on Plavix and atorvastatin 40mg  -denies associated side effects Blood pressure today 130/69. amlodipine added 2/7 in addition to lisinopril-hydrochlorothiazide - monitors at home which has been stable  Recent lipid panel 11/2020 LDL 83 down from 121  Referred to [facility] clinic for possible underlying sleep apnea - no show to initial evaluation with Dr. 12/2020 -he has not rescheduled - he reports improvement of insomnia but occasional day time fatigue but has been improving since he started to take vitamins (ACEs+Zinc)  No concerns at this time  Initial visit 09/06/2020 JM: Mr. Selvey is being seen for hospital follow-up unaccompanied.  Reports residual imbalance and visual spatial deficit which has been slowly improving.  Denies any difficulty with double or blurred vision.  He has not done any type of therapy  since discharge.  Reports a sensation generalized "weakness" and decreased activity tolerance.  Reports insomnia, excessive daytime fatigue and loud snoring.  He has not previously underwent sleep study. Denies new or worsening stroke/TIA symptoms.  Completed 3 weeks DAPT and remains on plavix alone without bleeding or bruising. Remains on atorvastatin without myalgias.  Blood pressure today satisfactory at 132/76.  No further concerns at this time.  Stroke admission 08/07/2020 Mr. Shrihaan Porzio is a 65 y.o. male with history of  HTN, HLD and stroke  who presented on 08/07/2020 with double vision x 2 weeks.  Personally reviewed pertinent hospitalization progress notes, lab work and imaging with summary provided.  Evaluated by Dr. 08/09/2020 with stroke work-up revealing right midbrain infarct secondary to small vessel disease source.  MRI showed evidence of old b/l basal ganglia and pontine lacunes, and old R frontal lobe white matter and thalamic hemorrhages.  On aspirin PTA -initiated DAPT for 3 weeks and Plavix alone. Hx of HTN stable on high-end with long-term BP goal normotensive range.  LDL 121 and initiate atorvastatin 40 mg daily.  No history of DM with A1c 6.2 indicating prediabetes.  Other stroke risk factors include EtOH use and prior strokes on imaging.  Evaluated by therapies who recommended [facility] PT/OT for residual gait impairment with imbalance and visual deficit and discharged home in stable condition.  Stroke:  R midbrain infarct secondary to small vessel disease source CT head No acute abnormality. Sinus dz MRI  R midbrain infarct. Old B basal ganglia and pontine lacunes. Old R frontal lobe white matter and thalamic hemorrhages.  MRA  No LVO. Mild distal L cavernous ICA narrowing w/ prestenotic dilitation Carotid Doppler  B ICA 1-39% stenosis, VAs antegrade  2D Echo EF 60-65% LDL 121 HgbA1c 6.2 VTE prophylaxis - Heparin 5000 units sq tid  aspirin 81 mg daily prior to admission, now on aspirin  81 mg daily and clopidogrel 75 mg daily. Continue DAPT x 3 weeks then plavix alone.   Therapy recommendations:  HH PT, HH OT Disposition:  Return home      ROS:   14 system review of systems performed and negative with exception of those listed in HPI  PMH:  Past Medical History:  Diagnosis Date   Hyperlipemia    Hypertension    Stroke (HCC)     PSH: No past surgical history on file.  Social History:  Social History   Socioeconomic History   Marital status: Single    Spouse name: Not on file   Number of children: Not on file   Years of education: Not on file   Highest education level: Not on file  Occupational History   Not on file  Tobacco Use   Smoking status: Never   Smokeless tobacco: Never  Substance and Sexual Activity   Alcohol use: Yes    Comment: varies in amount   Drug use: No   Sexual activity: Not on file  Other Topics Concern   Not on file  Social History Narrative   Not on file   Social Determinants of Health   Financial Resource Strain: Not on file  Food Insecurity: No Food Insecurity   Worried About Running Out of Food in the Last Year: Never true   Ran Out of Food in the Last Year: Never true  Transportation Needs: No Transportation Needs   Lack of Transportation (Medical): No   Lack of Transportation (Non-Medical): No  Physical Activity: Not on file  Stress: Not on file  Social Connections: Not on file  Intimate Partner Violence: Not on file    Family History:  Family History  Problem Relation Age of Onset   Arthritis Mother    Diabetes Mother    Hypertension Mother    Alcohol abuse Father     Medications:   Current Outpatient Medications on File Prior to Visit  Medication Sig Dispense Refill   amLODipine (NORVASC) 10 MG tablet Take 1 tablet (10 mg total) by mouth daily. 90 tablet 3   atorvastatin (LIPITOR) 40 MG tablet TAKE 1 TABLET BY MOUTH EVERY DAY 90 tablet 1   Cholecalciferol (VITAMIN D3) 50 MCG (2000 UT) capsule Take 1  capsule (2,000 Units total) by mouth daily. (Patient not taking: Reported on 06/14/2021) 90 capsule 1   clopidogrel (PLAVIX) 75 MG tablet Take 1 tablet (75 mg total) by mouth daily. 90 tablet 3   lisinopril-hydrochlorothiazide (ZESTORETIC) 20-25 MG tablet Take 1 tablet by mouth daily. 90 tablet 3   methocarbamol (ROBAXIN) 500 MG tablet Take 1 tablet (500 mg total) by mouth 2 (two) times daily. 10 tablet 0   Multiple Vitamin (MULTI-VITAMIN DAILY PO) Take 1 tablet by mouth daily. (Patient not taking: Reported on 06/14/2021)     pantoprazole (PROTONIX) 40 MG tablet Take 1 tablet (40 mg total) by mouth daily. 30 tablet 3   tadalafil (CIALIS) 5 MG tablet Take 1 tablet (5 mg total) by mouth daily. 30 tablet 3   No current facility-administered medications on file prior to visit.    Allergies:  No Known Allergies    OBJECTIVE:  Physical Exam  There were no vitals filed for this visit.  There is no height or weight on file to calculate BMI. No results found.  General: well developed, well nourished,  pleasant middle-aged African-American male, seated, in no evident distress Head: head normocephalic and atraumatic.   Neck: supple with no carotid or supraclavicular bruits Cardiovascular: regular rate and rhythm, no murmurs Musculoskeletal: no deformity Skin:  no rash/petichiae Vascular:  Normal pulses all extremities   Neurologic Exam Mental Status: Awake and fully alert.   Fluent speech and language.  Oriented to place and time. Recent and remote memory intact. Attention span, concentration and fund of knowledge appropriate. Mood and affect appropriate.  Cranial Nerves: Pupils equal, briskly reactive to light. Extraocular movements full except left eye uppercase incomplete.  No evidence of nystagmus.  Visual fields full to confrontation. Hearing intact. Facial sensation intact. Face, tongue, palate moves normally and symmetrically.  Motor: Normal bulk and tone. Normal strength in all tested  extremity muscles. Sensory.: intact to touch , pinprick , position and vibratory sensation.  Coordination: Rapid alternating movements normal in all extremities. Finger-to-nose and heel-to-shin performed accurately bilaterally. Gait and Station: Arises from chair without difficulty. Stance is normal. Gait demonstrates normal stride length and balance without use of assistive device.  Mild difficulty performing tandem walk and heel toe.  Romberg negative. Reflexes: 1+ and symmetric. Toes downgoing.        ASSESSMENT: Zac Torti is a 65 y.o. year old male presented with 2 wk hx of double vision on 08/07/2020 with stroke work-up revealing right midbrain infarct secondary to small vessel disease source. Vascular risk factors include HTN, HLD, prior strokes on imaging (b/l BG and pontine lacunes and R frontal lobe white matter and thalamic hemorrhages) and EtOH use.      PLAN:  R midbrain stroke :  Recovered well without residual deficit Continue clopidogrel 75 mg daily  and atorvastatin 40 mg daily for secondary stroke prevention.  Discussed secondary stroke prevention measures and importance of close PCP follow up for aggressive stroke risk factor management  HTN: BP goal <130/90.  Stable on amlodipine and lisinopril-hydrochlorothiazide per PCP HLD: LDL goal <70. Recent LDL 83 (11/2020) down from 094 on atorvastatin 40 mg daily Pre-DMII: A1c goal<7.0. Recent A1c 6.2.  Monitored by PCP Suspected sleep apnea: Referral placed to [facility] for further evaluation - no show for initial evaluation with Dr. Frances Furbish -encouraged rescheduling.  Discussed importance of evaluation for possible underlying sleep apnea as untreated sleep apnea increases risk of cardiovascular disease.  He wishes to hold off at this time but is aware to call office in the future if you like to proceed with evaluation    Follow up in 6 months or call earlier if needed   CC:  [facility] provider: Dr. Mosetta Anis,  Kinnie Scales, NP     I spent 30 minutes of face-to-face and non-face-to-face time with patient.  This included previsit chart review, lab review, study review, order entry, electronic health record documentation, patient education regarding prior stroke including etiology, importance of managing stroke risk factors and answered all other questions to patient satisfaction  Ihor Austin, [facility]  [facility] [address] [place], [place] [address]  Phone [phone] Fax [phone] Note: This document was prepared with digital dictation and possible smart phrase technology. Any transcriptional errors that result from this process are unintentional.

## 2021-07-09 ENCOUNTER — Other Ambulatory Visit (INDEPENDENT_AMBULATORY_CARE_PROVIDER_SITE_OTHER): Payer: Self-pay | Admitting: Primary Care

## 2021-07-09 DIAGNOSIS — Z760 Encounter for issue of repeat prescription: Secondary | ICD-10-CM

## 2021-07-09 DIAGNOSIS — K219 Gastro-esophageal reflux disease without esophagitis: Secondary | ICD-10-CM

## 2021-08-01 ENCOUNTER — Other Ambulatory Visit (INDEPENDENT_AMBULATORY_CARE_PROVIDER_SITE_OTHER): Payer: Self-pay | Admitting: Primary Care

## 2021-08-01 DIAGNOSIS — K219 Gastro-esophageal reflux disease without esophagitis: Secondary | ICD-10-CM

## 2021-08-01 DIAGNOSIS — Z760 Encounter for issue of repeat prescription: Secondary | ICD-10-CM

## 2021-08-01 MED ORDER — PANTOPRAZOLE SODIUM 40 MG PO TBEC: 40.0000 mg | DELAYED_RELEASE_TABLET | Freq: Every day | ORAL | 0 refills | Status: DC

## 2021-08-01 MED ORDER — ATORVASTATIN CALCIUM 40 MG PO TABS: 40.0000 mg | ORAL_TABLET | Freq: Every day | ORAL | 0 refills | Status: DC

## 2021-08-01 NOTE — Telephone Encounter (Signed)
Change of pharmacy- sending back to office : Multivitamin and clopidogrel: prescriber not at this office.and not assigned to a protocol  Requested Prescriptions  Pending Prescriptions Disp Refills  . pantoprazole (PROTONIX) 40 MG tablet 90 tablet 0    Sig: Take 1 tablet (40 mg total) by mouth daily.     Gastroenterology: Proton Pump Inhibitors Passed - 08/01/2021 12:21 PM      Passed - Valid encounter within last 12 months    Recent Outpatient Visits          1 month ago Need for immunization against influenza   [facility] Grayce Sessions, NP   4 months ago Prediabetes   [facility] Grayce Sessions, NP   8 months ago Essential hypertension   [facility] Grayce Sessions, NP   8 months ago Need for Tdap vaccination   [facility] Grayce Sessions, NP   11 months ago Essential hypertension   [facility] Grayce Sessions, NP             . Multiple Vitamin (MULTI-VITAMIN DAILY) TABS      Sig: Take by mouth daily.     There is no refill protocol information for this order    . clopidogrel (PLAVIX) 75 MG tablet 90 tablet     Sig: Take 1 tablet (75 mg total) by mouth daily.     Hematology: Antiplatelets - clopidogrel Failed - 08/01/2021 12:21 PM      Failed - Evaluate AST, ALT within 2 months of therapy initiation.      Passed - ALT in normal range and within 360 days    ALT  Date Value Ref Range Status  06/14/2021 10 0 - 44 IU/L Final         Passed - AST in normal range and within 360 days    AST  Date Value Ref Range Status  06/14/2021 12 0 - 40 IU/L Final         Passed - HCT in normal range and within 180 days    Hematocrit  Date Value Ref Range Status  06/14/2021 40.5 37.5 - 51.0 % Final         Passed - HGB in normal range and within 180 days    Hemoglobin  Date Value Ref Range Status  06/14/2021 13.1 13.0 - 17.7 g/dL Final          Passed - PLT in normal range and within 180 days    Platelets  Date Value Ref Range Status  06/14/2021 218 150 - 450 x10E3/uL Final         Passed - Valid encounter within last 6 months    Recent Outpatient Visits          1 month ago Need for immunization against influenza   [facility] Grayce Sessions, NP   4 months ago Prediabetes   [facility] Grayce Sessions, NP   8 months ago Essential hypertension   [facility] Grayce Sessions, NP   8 months ago Need for Tdap vaccination   [facility] Grayce Sessions, NP   11 months ago Essential hypertension   [facility] Edwards, Michelle P, NP             . atorvastatin (LIPITOR) 40 MG tablet 90  tablet 0    Sig: Take 1 tablet (40 mg total) by mouth daily.     Cardiovascular:  Antilipid - Statins Passed - 08/01/2021 12:21 PM      Passed - Total Cholesterol in normal range and within 360 days    Cholesterol, Total  Date Value Ref Range Status  06/14/2021 140 100 - 199 mg/dL Final         Passed - LDL in normal range and within 360 days    LDL Chol Calc (NIH)  Date Value Ref Range Status  06/14/2021 81 0 - 99 mg/dL Final         Passed - HDL in normal range and within 360 days    HDL  Date Value Ref Range Status  06/14/2021 40 >39 mg/dL Final         Passed - Triglycerides in normal range and within 360 days    Triglycerides  Date Value Ref Range Status  06/14/2021 102 0 - 149 mg/dL Final         Passed - Patient is not pregnant      Passed - Valid encounter within last 12 months    Recent Outpatient Visits          1 month ago Need for immunization against influenza   [facility] Grayce Sessions, NP   4 months ago Prediabetes   [facility] Grayce Sessions, NP   8 months ago Essential hypertension   [facility] Gray[facility] Sessions, NP   8 months ago Need for Tdap vaccination   [facility] Grayce Sessions, NP   11 months ago Essential hypertension   [facility] Grayce Sessions, NP

## 2021-08-01 NOTE — Telephone Encounter (Signed)
Copied from CRM [id number]. Topic: Quick Communication - Rx Refill/Question >> Aug 01, 2021 11:43 AM Gaetana Michaelis A wrote: Medication: Multiple Vitamin (MULTI-VITAMIN DAILY PO)   clopidogrel (PLAVIX) 75 MG tablet [[id number]]   pantoprazole (PROTONIX) 40 MG tablet [[id number]]   atorvastatin (LIPITOR) 40 MG tablet [[id number]]    Has the patient contacted their pharmacy? Yes.  Patient's pharmacy made contact on their behalf  (Agent: If no, request that the patient contact the pharmacy for the refill. If patient does not wish to contact the pharmacy document the reason why and proceed with request.) (Agent: If yes, when and what did the pharmacy advise?)  Preferred Pharmacy (with phone number or street name): [facility] - [place], [place] - [address] [place] [address] Phone: [phone] Fax: [phone]  Has the patient been seen for an appointment in the last year OR does the patient have an upcoming appointment? Yes.    Agent: Please be advised that RX refills may take up to 3 business days. We ask that you follow-up with your pharmacy.

## 2021-08-01 NOTE — Telephone Encounter (Signed)
Requested medication (s) are due for refill today: yes  Requested medication (s) are on the active medication list: historical med  Last refill:  12/27/20  Future visit scheduled: no  Notes to clinic:  historical provider   Requested Prescriptions  Pending Prescriptions Disp Refills   Multiple Vitamin (MULTI-VITAMIN DAILY) TABS      Sig: Take by mouth daily.     There is no refill protocol information for this order    Signed Prescriptions Disp Refills   pantoprazole (PROTONIX) 40 MG tablet 90 tablet 0    Sig: Take 1 tablet (40 mg total) by mouth daily.     Gastroenterology: Proton Pump Inhibitors Passed - 08/01/2021 12:21 PM      Passed - Valid encounter within last 12 months    Recent Outpatient Visits           1 month ago Need for immunization against influenza   [facility] Gray[facility] Sessions, NP   4 months ago Prediabetes   [facility] Grayce Sessions, NP   8 months ago Essential hypertension   [facility] Grayce Sessions, NP   8 months ago Need for Tdap vaccination   [facility] Grayce Sessions, NP   11 months ago Essential hypertension   [facility] Gwinda Passe P, NP               atorvastatin (LIPITOR) 40 MG tablet 90 tablet 0    Sig: Take 1 tablet (40 mg total) by mouth daily.     Cardiovascular:  Antilipid - Statins Passed - 08/01/2021 12:21 PM      Passed - Total Cholesterol in normal range and within 360 days    Cholesterol, Total  Date Value Ref Range Status  06/14/2021 140 100 - 199 mg/dL Final          Passed - LDL in normal range and within 360 days    LDL Chol Calc (NIH)  Date Value Ref Range Status  06/14/2021 81 0 - 99 mg/dL Final          Passed - HDL in normal range and within 360 days    HDL  Date Value Ref Range Status  06/14/2021 40 >39 mg/dL Final          Passed - Triglycerides in normal  range and within 360 days    Triglycerides  Date Value Ref Range Status  06/14/2021 102 0 - 149 mg/dL Final          Passed - Patient is not pregnant      Passed - Valid encounter within last 12 months    Recent Outpatient Visits           1 month ago Need for immunization against influenza   [facility] Grayce Sessions, NP   4 months ago Prediabetes   [facility] Grayce Sessions, NP   8 months ago Essential hypertension   [facility] Grayce Sessions, NP   8 months ago Need for Tdap vaccination   [facility] Grayce Sessions, NP   11 months ago Essential hypertension   [facility] Grayce Sessions, NP              Refused Prescriptions Disp Refills   clopidogrel (PLAVIX) 75 MG tablet 90 tablet  Sig: Take 1 tablet (75 mg total) by mouth daily.     Hematology: Antiplatelets - clopidogrel Failed - 08/01/2021 12:21 PM      Failed - Evaluate AST, ALT within 2 months of therapy initiation.      Passed - ALT in normal range and within 360 days    ALT  Date Value Ref Range Status  06/14/2021 10 0 - 44 IU/L Final          Passed - AST in normal range and within 360 days    AST  Date Value Ref Range Status  06/14/2021 12 0 - 40 IU/L Final          Passed - HCT in normal range and within 180 days    Hematocrit  Date Value Ref Range Status  06/14/2021 40.5 37.5 - 51.0 % Final          Passed - HGB in normal range and within 180 days    Hemoglobin  Date Value Ref Range Status  06/14/2021 13.1 13.0 - 17.7 g/dL Final          Passed - PLT in normal range and within 180 days    Platelets  Date Value Ref Range Status  06/14/2021 218 150 - 450 x10E3/uL Final          Passed - Valid encounter within last 6 months    Recent Outpatient Visits           1 month ago Need for immunization against influenza   [facility] Gray[facility] Sessions, NP   4 months ago Prediabetes   [facility] Grayce Sessions, NP   8 months ago Essential hypertension   [facility] Grayce Sessions, NP   8 months ago Need for Tdap vaccination   [facility] Grayce Sessions, NP   11 months ago Essential hypertension   [facility] Grayce Sessions, NP

## 2021-08-28 ENCOUNTER — Other Ambulatory Visit (INDEPENDENT_AMBULATORY_CARE_PROVIDER_SITE_OTHER): Payer: Self-pay | Admitting: Primary Care

## 2021-10-17 ENCOUNTER — Other Ambulatory Visit (INDEPENDENT_AMBULATORY_CARE_PROVIDER_SITE_OTHER): Payer: Self-pay | Admitting: Primary Care

## 2021-10-17 DIAGNOSIS — E559 Vitamin D deficiency, unspecified: Secondary | ICD-10-CM

## 2021-10-17 NOTE — Telephone Encounter (Signed)
Sent to PCP to refill if appropriate.  

## 2021-10-24 ENCOUNTER — Other Ambulatory Visit: Payer: Self-pay

## 2021-10-24 ENCOUNTER — Ambulatory Visit (INDEPENDENT_AMBULATORY_CARE_PROVIDER_SITE_OTHER): Payer: Medicare HMO

## 2021-10-24 ENCOUNTER — Encounter (HOSPITAL_COMMUNITY): Payer: Self-pay | Admitting: *Deleted

## 2021-10-24 ENCOUNTER — Ambulatory Visit (HOSPITAL_COMMUNITY)
Admission: EM | Admit: 2021-10-24 | Discharge: 2021-10-24 | Disposition: A | Discharge status: Home / Self Care | Payer: Medicare HMO | Attending: Family Medicine | Admitting: Family Medicine

## 2021-10-24 ENCOUNTER — Ambulatory Visit: Payer: Self-pay | Admitting: Surgery

## 2021-10-24 DIAGNOSIS — R140 Abdominal distension (gaseous): Secondary | ICD-10-CM | POA: Diagnosis not present

## 2021-10-24 DIAGNOSIS — R1013 Epigastric pain: Secondary | ICD-10-CM

## 2021-10-24 DIAGNOSIS — K5900 Constipation, unspecified: Secondary | ICD-10-CM

## 2021-10-24 DIAGNOSIS — R109 Unspecified abdominal pain: Secondary | ICD-10-CM | POA: Diagnosis not present

## 2021-10-24 NOTE — Discharge Instructions (Signed)
Your x-rays just showed some stool in the colon and a good bit of gas in the intestines.  Restart pantoprazole 1 daily.  Try taking a Dulcolax suppository over-the-counter for the constipation.  Also start taking MiraLAX daily according to the package instructions 1 capful daily for constipation.  If this is not improving, then see your primary doctor

## 2021-10-24 NOTE — ED Triage Notes (Signed)
Pt awake all night with hiccups ,Abd pain and constipation.

## 2021-10-24 NOTE — H&P (Signed)
Subjective   Chief Complaint: New Patient (New patient chronic boil bottom left buttock thigh)     History of Present Illness: Richard Myers is a 66 y.o. male who is seen today as an office consultation at the request of NP Edwards for evaluation of New Patient (New patient chronic boil bottom left buttock thigh) .    This is a 66 year old male with a history of hypertension, hyperlipidemia, previous stroke on chronic Plavix who presents with a 1 year history of a left gluteal abscess.  The patient states that he started with a small lump just under the skin.  He he has had 2 episodes where it became very large and exquisitely tender.  He had difficulty ambulating.  Both times this area spontaneously burst and drained some purulent fluid.  Currently his symptoms are fairly mild.  He has never had this surgically treated.   Review of Systems: A complete review of systems was obtained from the patient.  I have reviewed this information and discussed as appropriate with the patient.  See HPI as well for other ROS.  Review of Systems  Constitutional: Negative.   HENT: Negative.   Eyes: Negative.   Respiratory: Negative.   Cardiovascular: Negative.   Gastrointestinal: Positive for abdominal pain and constipation.  Genitourinary: Negative.   Musculoskeletal: Negative.   Skin: Negative.   Neurological: Negative.   Endo/Heme/Allergies: Negative.   Psychiatric/Behavioral: Negative.       Medical History: Past Medical History:  Diagnosis Date   History of stroke     Patient Active Problem List  Diagnosis   Acute CVA (cerebrovascular accident) (CMS-HCC)   Boil of buttock   Dyslipidemia   Essential hypertension    History reviewed. No pertinent surgical history.   No Known Allergies  Current Outpatient Medications on File Prior to Visit  Medication Sig Dispense Refill   atorvastatin (LIPITOR) 40 MG tablet Take 1 tablet by mouth once daily     cholecalciferol (VITAMIN D3)  2,000 unit capsule Take 1 tablet by mouth once daily     lisinopriL-hydrochlorothiazide (ZESTORETIC) 20-25 mg tablet Take 1 tablet by mouth once daily     pantoprazole (PROTONIX) 40 MG DR tablet Take 40 mg by mouth once daily     No current facility-administered medications on file prior to visit.    Family History  Problem Relation Age of Onset   High blood pressure (Hypertension) Mother    Stroke Brother      Social History   Tobacco Use  Smoking Status Never  Smokeless Tobacco Never     Social History   Socioeconomic History   Marital status: Single  Tobacco Use   Smoking status: Never   Smokeless tobacco: Never  Substance and Sexual Activity   Alcohol use: Yes   Drug use: Never    Objective:    Vitals:   10/24/21 1538  BP: 132/70  Pulse: 66  Temp: 36.3 C (97.3 F)  SpO2: 98%  Weight: 81.8 kg (180 lb 6.4 oz)  Height: 160 cm (5\' 3" )    Body mass index is 31.96 kg/m.  Physical Exam   Constitutional:  WDWN in NAD, conversant, no obvious deformities; lying in bed comfortably Eyes:  Pupils equal, round; sclera anicteric; moist conjunctiva; no lid lag HENT:  Oral mucosa moist; good dentition  Neck:  No masses palpated, trachea midline; no thyromegaly Lungs:  CTA bilaterally; normal respiratory effort CV:  Regular rate and rhythm; no murmurs; extremities well-perfused with no edema Abd:  +  bowel sounds, soft, non-tender, no palpable organomegaly; no palpable hernias The left gluteal region shows a protruding area of scar tissue about 2 cm in diameter.  Currently there is no cellulitis or induration around this area.  Minimally tender to palpation.  The mass is relatively superficial. Musc:  Unable to assess gait; no apparent clubbing or cyanosis in extremities Lymphatic:  No palpable cervical or axillary lymphadenopathy Skin:  Warm, dry; no sign of jaundice Psychiatric - alert and oriented x 4; calm mood and affect     Assessment and Plan:  Diagnoses and  all orders for this visit:  Gluteal abscess     Recommend excision of chronic gluteal abscess.The surgical procedure has been discussed with the patient.  Potential risks, benefits, alternative treatments, and expected outcomes have been explained.  All of the patient's questions at this time have been answered.  The likelihood of reaching the patient's treatment goal is good.  The patient understand the proposed surgical procedure and wishes to proceed. Hold Plavix 5 days prior to surgery  No follow-ups on file.  Lissa Morales, MD  10/24/2021 3:55 PM

## 2021-10-24 NOTE — ED Provider Notes (Signed)
[place]    CSN: [id number] Arrival date & time: 10/24/21  0944      History   Chief Complaint Chief Complaint  Patient presents with   Abdominal Pain   Hiccups   Constipation    HPI Richard Myers is a 66 y.o. male.    Abdominal Pain Associated symptoms: constipation   Constipation Associated symptoms: abdominal pain   Here with history of upper abdominal pain since January 14.  He does feel bloated also.  His last bowel movement was on January 13.  He has thrown up twice.  There is no blood in it.  No fever or chills.  He has taken some Pepto-Bismol.  He has noted passing flatus last night  PMH: htn, on lisinoprilHCT. Has had CVA, on plavix. Has been out of pantoprazole  Past Medical History:  Diagnosis Date   Hyperlipemia    Hypertension    Stroke [facility])     Patient Active Problem List   Diagnosis Date Noted   Boil of buttock 02/23/2021   Acute CVA (cerebrovascular accident) ([place]) 08/07/2020   Dyslipidemia 08/07/2020   Binocular vision disorder with diplopia 08/07/2020   CVA (cerebral vascular accident) ([place]) 08/07/2020   Essential hypertension 01/03/2020    History reviewed. No pertinent surgical history.     Home Medications    Prior to Admission medications   Medication Sig Start Date End Date Taking? Authorizing Provider  amLODipine (NORVASC) 10 MG tablet Take 1 tablet (10 mg total) by mouth daily. 03/14/21   Kerin Perna, NP  atorvastatin (LIPITOR) 40 MG tablet TAKE ONE TABLET BY MOUTH DAILY 10/18/21   Kerin Perna, NP  Cholecalciferol (VITAMIN D3) 50 MCG (2000 UT) capsule TAKE ONE TABLET BY MOUTH EVERY DAY 10/18/21   Kerin Perna, NP  clopidogrel (PLAVIX) 75 MG tablet Take 1 tablet (75 mg total) by mouth daily. 01/13/21   Melvenia Beam, MD  lisinopril-hydrochlorothiazide (ZESTORETIC) 20-25 MG tablet Take 1 tablet by mouth daily. 03/14/21   Kerin Perna, NP  methocarbamol (ROBAXIN) 500 MG tablet Take 1 tablet  (500 mg total) by mouth 2 (two) times daily. 05/11/21   Khatri, Hina, PA-C  Multiple Vitamin (MULTI-VITAMIN DAILY PO) Take 1 tablet by mouth daily. Patient not taking: Reported on 06/14/2021    [provider]  pantoprazole (PROTONIX) 40 MG tablet Take 1 tablet (40 mg total) by mouth daily. 08/01/21   Kerin Perna, NP  tadalafil (CIALIS) 5 MG tablet Take 1 tablet (5 mg total) by mouth daily. 06/14/21   Kerin Perna, NP    Family History Family History  Problem Relation Age of Onset   Arthritis Mother    Diabetes Mother    Hypertension Mother    Alcohol abuse Father     Social History Social History   Tobacco Use   Smoking status: Never   Smokeless tobacco: Never  Substance Use Topics   Alcohol use: Yes    Comment: varies in amount   Drug use: No     Allergies   Patient has no known allergies.   Review of Systems Review of Systems  Gastrointestinal:  Positive for abdominal pain and constipation.    Physical Exam Triage Vital Signs ED Triage Vitals  Enc Vitals Group     BP 10/24/21 1008 (!) 144/99     Pulse Rate 10/24/21 1008 (!) 121     Resp 10/24/21 1008 20     Temp 10/24/21 1008 98 F (36.7  C)     Temp src --      SpO2 10/24/21 1008 98 %     Weight --      Height --      Head Circumference --      Peak Flow --      Pain Score 10/24/21 1006 8     Pain Loc --      Pain Edu? --      Excl. in [place]? --    No data found.  Updated Vital Signs BP (!) 144/99    Pulse (!) 121    Temp 98 F (36.7 C)    Resp 20    SpO2 98%   Visual Acuity Right Eye Distance:   Left Eye Distance:   Bilateral Distance:    Right Eye Near:   Left Eye Near:    Bilateral Near:     Physical Exam Vitals reviewed.  Constitutional:      General: He is not in acute distress.    Appearance: He is not toxic-appearing or diaphoretic.  Eyes:     Extraocular Movements: Extraocular movements intact.     Pupils: Pupils are equal, round, and reactive to light.   Cardiovascular:     Rate and Rhythm: Normal rate and regular rhythm.     Heart sounds: No murmur heard. Pulmonary:     Effort: Pulmonary effort is normal.     Breath sounds: Normal breath sounds.  Abdominal:     Palpations: There is no mass.     Tenderness: There is abdominal tenderness (epigastric). There is no guarding.     Comments: Abd tympanitic to percussion. BS audible  Musculoskeletal:     Cervical back: Neck supple.  Lymphadenopathy:     Cervical: No cervical adenopathy.  Skin:    Coloration: Skin is not jaundiced or pale.  Neurological:     General: No focal deficit present.     Mental Status: He is alert and oriented to person, place, and time.  Psychiatric:        Behavior: Behavior normal.     UC Treatments / Results  Labs (all labs ordered are listed, but only abnormal results are displayed) Labs Reviewed - No data to display  EKG   Radiology DG Abd 2 Views  Result Date: 10/24/2021 CLINICAL DATA:  Abdominal pain, distension EXAM: ABDOMEN - 2 VIEW COMPARISON:  None. FINDINGS: Unremarkable bowel gas pattern. Mild stool burden. No free air. No calculi. No acute osseous abnormality. IMPRESSION: Unremarkable bowel gas pattern. Electronically Signed   By: Macy Mis M.D.   On: 10/24/2021 11:16    Procedures Procedures (including critical care time)  Medications Ordered in UC Medications - No data to display  Initial Impression / Assessment and Plan / UC Course  I have reviewed the triage vital signs and the nursing notes.  Pertinent labs & imaging results that were available during my care of the patient were reviewed by me and considered in my medical decision making (see chart for details).     No AF levels. Has a good bit of bowel gas, and some stool in the colon.  Final Clinical Impressions(s) / UC Diagnoses   Final diagnoses:  Epigastric pain  Constipation, unspecified constipation type     Discharge Instructions      Your x-rays just  showed some stool in the colon and a good bit of gas in the intestines.  Restart pantoprazole 1 daily.  Try taking a Dulcolax suppository over-the-counter  for the constipation.  Also start taking MiraLAX daily according to the package instructions 1 capful daily for constipation.  If this is not improving, then see your primary doctor     ED Prescriptions   None    PDMP not reviewed this encounter.   Barrett Henle, MD 10/24/21 1122

## 2021-11-10 ENCOUNTER — Encounter (HOSPITAL_BASED_OUTPATIENT_CLINIC_OR_DEPARTMENT_OTHER): Payer: Self-pay | Admitting: Surgery

## 2021-11-10 ENCOUNTER — Other Ambulatory Visit: Payer: Self-pay

## 2021-11-10 ENCOUNTER — Telehealth (INDEPENDENT_AMBULATORY_CARE_PROVIDER_SITE_OTHER): Payer: Self-pay | Admitting: Primary Care

## 2021-11-10 NOTE — Progress Notes (Signed)
Reviewed chart with Dr. Smith Robert regarding pt's ECHO results. Okay to proceed with surgery.

## 2021-11-10 NOTE — Telephone Encounter (Signed)
Abigail Butts nurse, from [facility] called in to ask Dr Scot Jun, if its ok for the med plavix to be held off a week before ot surgery on Feb 14. Please call back

## 2021-11-10 NOTE — Telephone Encounter (Signed)
Contact Toniann Fail at [facility] to advise if patient can stop Plavix for a week prior to surgery.

## 2021-11-10 NOTE — Progress Notes (Addendum)
Spoke with Abigail Butts from [place] when pt should stop plavix. She will follow up and call back.

## 2021-11-13 NOTE — Telephone Encounter (Signed)
She has not been seen in office since 12/2020. Typically recommend no elective procedure for 3 to 6 months post stroke, after that time, can hold with small but acceptable risk of recurrent stroke while off therapy and recommend restarting immediately after or once stable. In order to completely clear, will need to be seen in office as almost 1 year since prior visit. Unsure if she will be able to be seen prior to scheduled surgery date.

## 2021-11-13 NOTE — Telephone Encounter (Signed)
Spoke with Toniann Fail - surgery may have to be reschedule.

## 2021-11-21 ENCOUNTER — Ambulatory Visit (HOSPITAL_BASED_OUTPATIENT_CLINIC_OR_DEPARTMENT_OTHER): Admission: RE | Admit: 2021-11-21 | Payer: Medicare HMO | Source: Home / Self Care | Admitting: Surgery

## 2021-11-21 DIAGNOSIS — I10 Essential (primary) hypertension: Secondary | ICD-10-CM

## 2021-11-21 HISTORY — DX: Gastro-esophageal reflux disease without esophagitis: K21.9

## 2021-11-21 SURGERY — EXCISION MASS
Anesthesia: General

## 2021-11-22 ENCOUNTER — Other Ambulatory Visit (INDEPENDENT_AMBULATORY_CARE_PROVIDER_SITE_OTHER): Payer: Self-pay | Admitting: Primary Care

## 2021-11-23 NOTE — Telephone Encounter (Signed)
Refilled 10/18/2021 #90 [id number] refills. Should last until 01/16/2022. Requested Prescriptions  Pending Prescriptions Disp Refills   atorvastatin (LIPITOR) 40 MG tablet [Pharmacy Med Name: atorvastatin 40 mg tablet] 90 tablet 0    Sig: TAKE ONE TABLET BY MOUTH DAILY     Cardiovascular:  Antilipid - Statins Failed - 11/22/2021  7:25 PM      Failed - Lipid Panel in normal range within the last 12 months    Cholesterol, Total  Date Value Ref Range Status  06/14/2021 140 100 - 199 mg/dL Final   LDL Chol Calc (NIH)  Date Value Ref Range Status  06/14/2021 81 0 - 99 mg/dL Final   HDL  Date Value Ref Range Status  06/14/2021 40 >39 mg/dL Final   Triglycerides  Date Value Ref Range Status  06/14/2021 102 0 - 149 mg/dL Final         Passed - Patient is not pregnant      Passed - Valid encounter within last 12 months    Recent Outpatient Visits          5 months ago Need for immunization against influenza   [facility] Grayce Sessions, NP   8 months ago Prediabetes   [facility] Grayce Sessions, NP   12 months ago Essential hypertension   [facility] Grayce Sessions, NP   1 year ago Need for Tdap vaccination   [facility] Grayce Sessions, NP   1 year ago Essential hypertension   [facility] Grayce Sessions, NP

## 2021-12-13 IMAGING — CT CT CHEST W/ CM
2 of 3 series · 15 of 36 positions shown, 18 images · IV contrast (Omni 300)
Comparison: None.

CLINICAL DATA: Traumatic rib fracture suspected

EXAM:
CT CHEST WITH CONTRAST
TECHNIQUE: Multidetector CT imaging of the chest was performed during
intravenous contrast administration.
CONTRAST:  50mL OMNIPAQUE IOHEXOL 300 MG/ML  SOLN

[Series 3: chest with 2mm st · axial · 0.82mm/px · z∈[-308,-32]mm · 12 of 163 slices shown, 15 images]
[im 13/163  mediastinal]
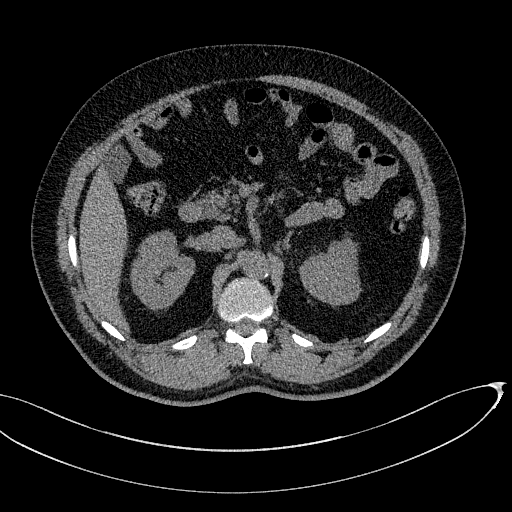
[im 13/163  lung]
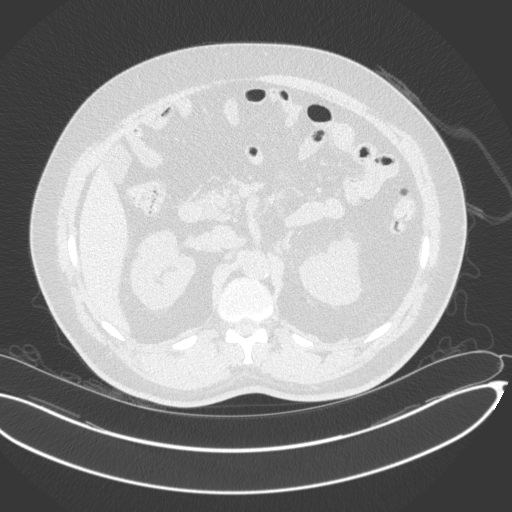
[im 25/163  lung]
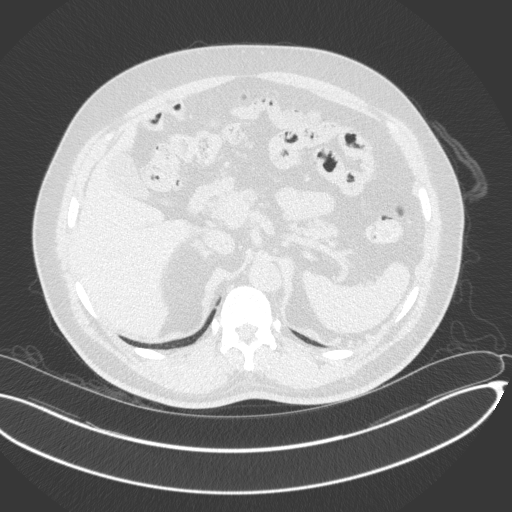
[im 37/163  lung]
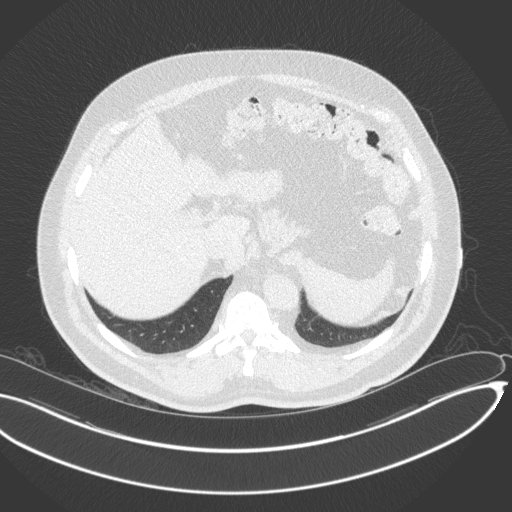
[im 49/163  lung]
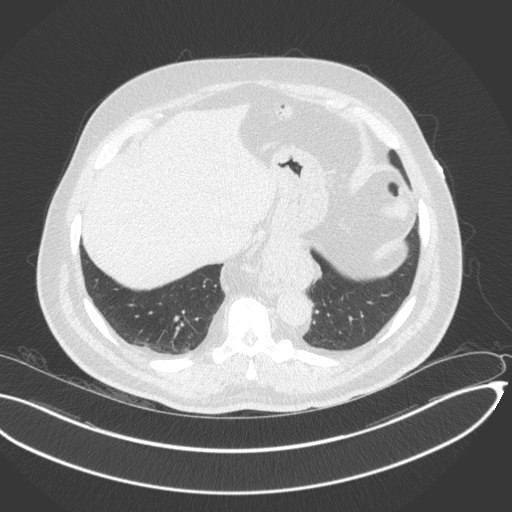
[im 61/163  mediastinal]
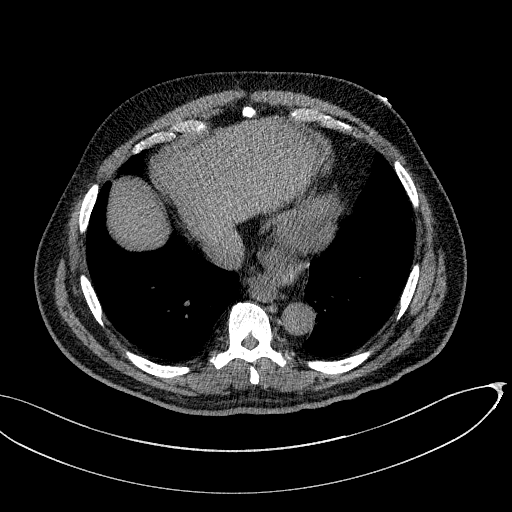
[im 61/163  lung]
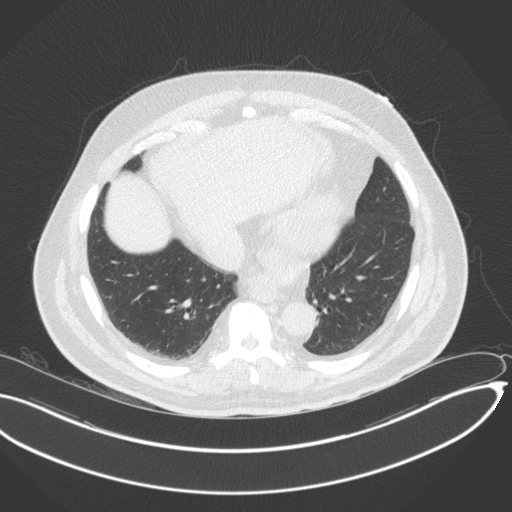
[im 73/163  lung]
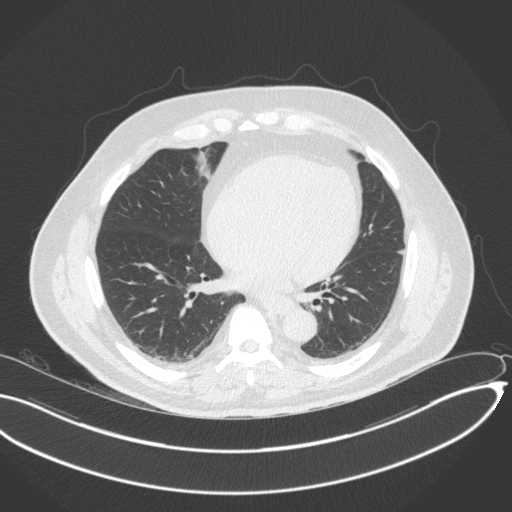
[im 91/163  lung]
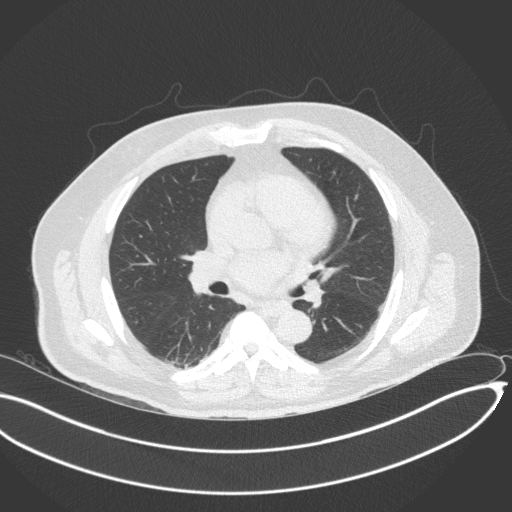
[im 103/163  lung]
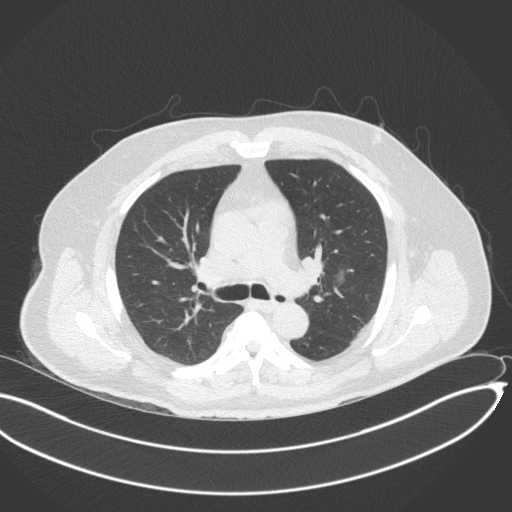
[im 115/163  mediastinal]
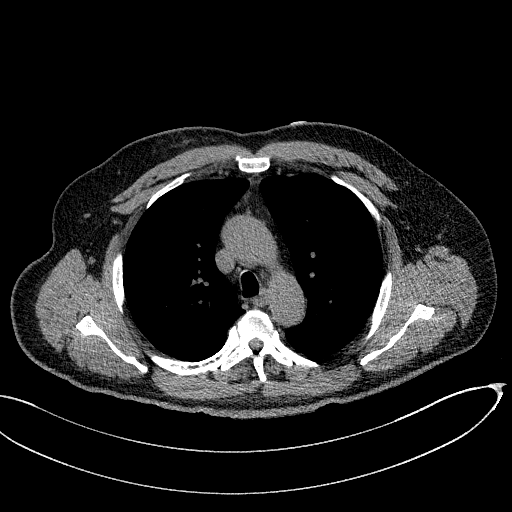
[im 115/163  lung]
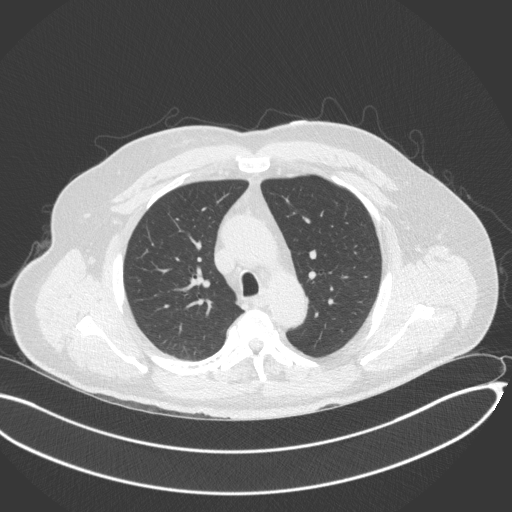
[im 127/163  lung]
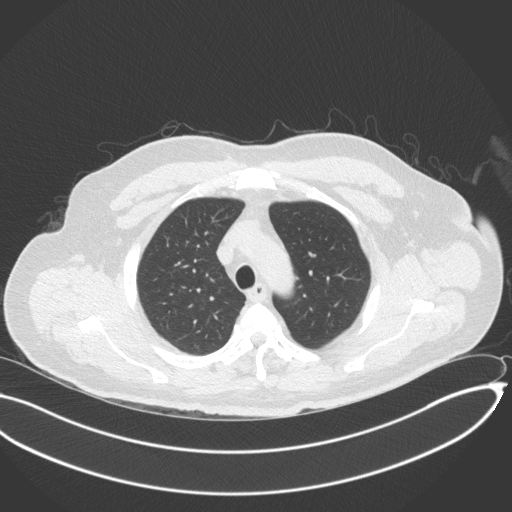
[im 139/163  lung]
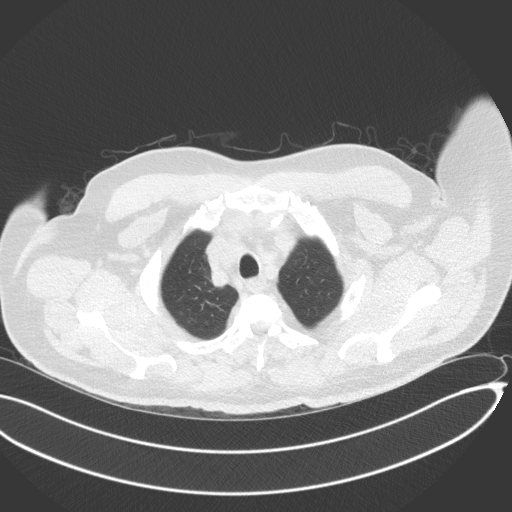
[im 151/163  lung]
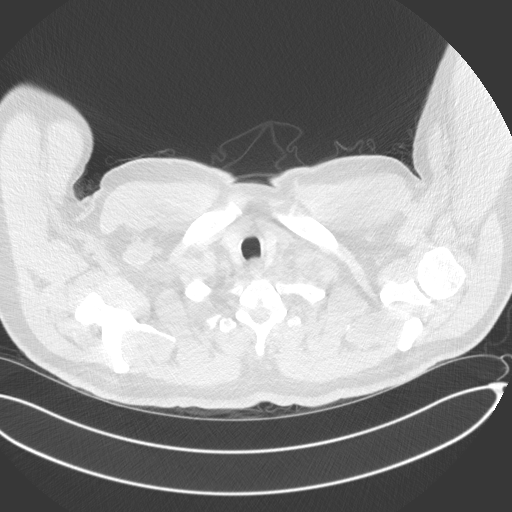

[Series 6: chest with 2mm st cor · coronal · 0.64mm/px · 3 of 124 slices shown]
[im 25/124  lung]
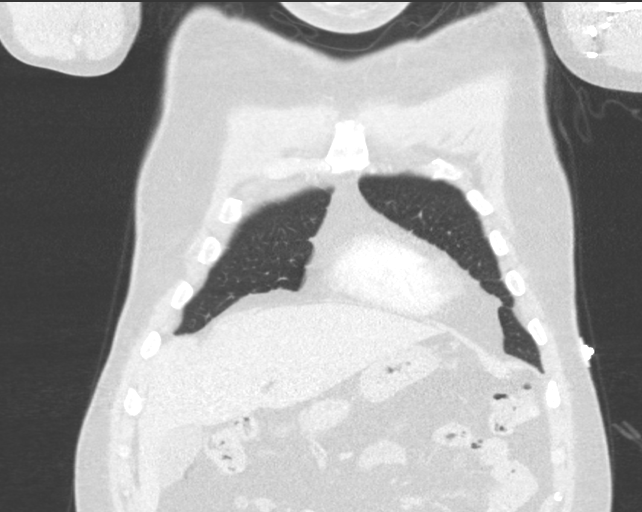
[im 50/124  lung]
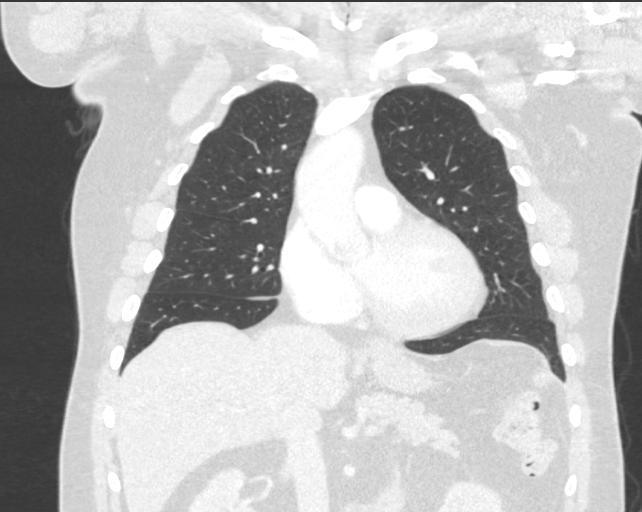
[im 74/124  lung]
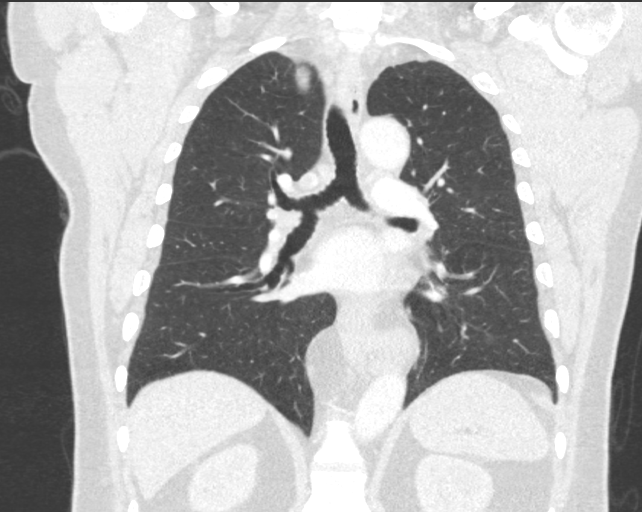

[15 of 36 positions shown; findings below may reference images not displayed]

FINDINGS: Cardiovascular: Heart is normal size. Aorta is normal caliber. Few
scattered punctate calcifications in the descending thoracic aorta

Mediastinum/Nodes: No mediastinal, hilar, or axillary adenopathy.
Trachea and esophagus are unremarkable. Thyroid unremarkable.
Moderate-sized hiatal hernia.

Lungs/Pleura: Lungs are clear. No focal airspace opacities or
suspicious nodules. No effusions. No pneumothorax

Upper Abdomen: Imaging into the upper abdomen demonstrates no acute
findings.

Musculoskeletal: Chest wall soft tissues are unremarkable. No acute
bony abnormality. No visible rib fracture.
IMPRESSION: No visible rib fracture.

Moderate-sized hiatal hernia.

No acute cardiopulmonary disease.

## 2022-01-05 ENCOUNTER — Encounter (INDEPENDENT_AMBULATORY_CARE_PROVIDER_SITE_OTHER): Payer: Self-pay

## 2022-01-05 ENCOUNTER — Ambulatory Visit (INDEPENDENT_AMBULATORY_CARE_PROVIDER_SITE_OTHER): Payer: Medicare Other

## 2022-01-05 DIAGNOSIS — Z0000 Encounter for general adult medical examination without abnormal findings: Secondary | ICD-10-CM

## 2022-01-05 NOTE — Patient Instructions (Signed)

## 2022-01-05 NOTE — Progress Notes (Signed)
? ?Subjective:  ? Richard Myers is a 66 y.o. male who presents for an Initial Medicare Annual Wellness Visit. ?I connected with  Richard Myers on 01/05/22 by a audio enabled telemedicine application and verified that I am speaking with the correct person using two identifiers. ? ?Patient Location: Home ? ?Provider Location: Home Office ? ?I discussed the limitations of evaluation and management by telemedicine. The patient expressed understanding and agreed to proceed.  ? ?   ?Objective:  ?  ?There were no vitals filed for this visit. ?There is no height or weight on file to calculate BMI. ? ? ?  08/15/2020  ?  7:25 PM 08/08/2020  ?  6:00 PM  ?Advanced Directives  ?Does Patient Have a Medical Advance Directive? No No  ?Would patient like information on creating a medical advance directive? No - Patient declined No - Patient declined  ? ? ?Current Medications (verified) ?Outpatient Encounter Medications as of 01/05/2022  ?Medication Sig  ? amLODipine (NORVASC) 10 MG tablet Take 1 tablet (10 mg total) by mouth daily.  ? atorvastatin (LIPITOR) 40 MG tablet TAKE ONE TABLET BY MOUTH DAILY  ? Cholecalciferol (VITAMIN D3) 50 MCG (2000 UT) capsule TAKE ONE TABLET BY MOUTH EVERY DAY  ? clopidogrel (PLAVIX) 75 MG tablet Take 1 tablet (75 mg total) by mouth daily.  ? Iron, Ferrous Sulfate, 325 (65 Fe) MG TABS Take by mouth.  ? lisinopril-hydrochlorothiazide (ZESTORETIC) 20-25 MG tablet Take 1 tablet by mouth daily.  ? pantoprazole (PROTONIX) 40 MG tablet Take 1 tablet (40 mg total) by mouth daily.  ? Multiple Vitamin (MULTI-VITAMIN DAILY PO) Take 1 tablet by mouth daily. (Patient not taking: Reported on 01/05/2022)  ? ?No facility-administered encounter medications on file as of 01/05/2022.  ? ? ?Allergies (verified) ?Patient has no known allergies.  ? ?History: ?Past Medical History:  ?Diagnosis Date  ? GERD (gastroesophageal reflux disease)   ? Hyperlipemia   ? Hypertension   ? Stroke [facility](HCC)   ? ?History reviewed. No  pertinent surgical history. ?Family History  ?Problem Relation Age of Onset  ? Arthritis Mother   ? Diabetes Mother   ? Hypertension Mother   ? Alcohol abuse Father   ? ?Social History  ? ?Socioeconomic History  ? Marital status: Single  ?  Spouse name: Not on file  ? Number of children: Not on file  ? Years of education: Not on file  ? Highest education level: Not on file  ?Occupational History  ? Not on file  ?Tobacco Use  ? Smoking status: Never  ? Smokeless tobacco: Never  ?Substance and Sexual Activity  ? Alcohol use: Not Currently  ?  Comment: varies in amount  ? Drug use: No  ? Sexual activity: Yes  ?Other Topics Concern  ? Not on file  ?Social History Narrative  ? Not on file  ? ?Social Determinants of Health  ? ?Financial Resource Strain: Low Risk   ? Difficulty of Paying Living Expenses: Not hard at all  ?Food Insecurity: No Food Insecurity  ? Worried About Programme researcher, broadcasting/film/videounning Out of Food in the Last Year: Never true  ? Ran Out of Food in the Last Year: Never true  ?Transportation Needs: No Transportation Needs  ? Lack of Transportation (Medical): No  ? Lack of Transportation (Non-Medical): No  ?Physical Activity: Inactive  ? Days of Exercise per Week: 0 days  ? Minutes of Exercise per Session: 0 min  ?Stress: No Stress Concern Present  ? Feeling of Stress :  Not at all  ?Social Connections: Moderately Isolated  ? Frequency of Communication with Friends and Family: More than three times a week  ? Frequency of Social Gatherings with Friends and Family: Twice a week  ? Attends Religious Services: More than 4 times per year  ? Active Member of Clubs or Organizations: No  ? Attends Banker Meetings: Never  ? Marital Status: Never married  ? ? ?Tobacco Counseling ?Counseling given: Not Answered ? ? ?Clinical Intake: ? ?Pre-visit preparation completed: Yes ? ?Pain : No/denies pain ? ?  ? ?  ? ?  ? ?Diabetic?no  ? ?Interpreter Needed?: No ? ?  ? ? ?Activities of Daily Living ? ?  01/05/2022  ?  2:08 PM  ?In your  present state of health, do you have any difficulty performing the following activities:  ?Hearing? 0  ?Vision? 0  ?Difficulty concentrating or making decisions? 0  ?Walking or climbing stairs? 0  ?Dressing or bathing? 0  ?Doing errands, shopping? 0  ? ? ?Patient Care Team: ?Grayce Sessions, NP as PCP - General (Internal Medicine) ? ?Indicate any recent Medical Services you may have received from other than [facility] providers in the past year (date may be approximate). ? ?   ?Assessment:  ? This is a routine wellness examination for Richard Myers. ? ?Hearing/Vision screen ?No results found. ? ?Dietary issues and exercise activities discussed: ?  ? ? Goals Addressed   ?None ?  ?Depression Screen ? ?  01/05/2022  ?  2:05 PM 06/14/2021  ? 10:59 AM 03/14/2021  ?  2:42 PM 02/22/2021  ? 11:59 AM 11/28/2020  ? 10:00 AM 11/14/2020  ? 10:16 AM 09/06/2020  ?  9:48 AM  ?PHQ 2/9 Scores  ?PHQ - 2 Score 0 0 0 0 0 0 0  ?  ?Fall Risk ? ?  01/05/2022  ?  2:08 PM 06/14/2021  ? 10:59 AM 03/14/2021  ?  2:42 PM 11/28/2020  ? 10:00 AM 11/14/2020  ? 10:16 AM  ?Fall Risk   ?Falls in the past year? 0 0 0 0 0  ?Number falls in past yr: 0      ?Injury with Fall? 0      ?Risk for fall due to : No Fall Risks      ?Follow up Falls evaluation completed      ? ? ?FALL RISK PREVENTION PERTAINING TO THE HOME: ? ?Any stairs in or around the home? No  ?If so, are there any without handrails? No  ?Home free of loose throw rugs in walkways, pet beds, electrical cords, etc? Yes  ?Adequate lighting in your home to reduce risk of falls? Yes  ? ?ASSISTIVE DEVICES UTILIZED TO PREVENT FALLS: ? ?Life alert? No  ?Use of a cane, walker or w/c? No  ?Grab bars in the bathroom? Yes  ?Shower chair or bench in shower? Yes  ?Elevated toilet seat or a handicapped toilet? No  ? ? ?Cognitive Function: ?  ?  ? ?  01/05/2022  ?  2:09 PM  ?6CIT Screen  ?What Year? 4 points  ?What month? 3 points  ?What time? 3 points  ?Count back from 20 0 points  ?Months in reverse 0 points  ?Repeat phrase 0  points  ?Total Score 10 points  ? ? ?Immunizations ?Immunization History  ?Administered Date(s) Administered  ? Influenza,inj,Quad PF,6+ Mos 08/09/2020, 06/14/2021  ? PFIZER(Purple Top)SARS-COV-2 Vaccination 06/16/2020, 07/26/2020, 01/14/2021  ? Pneumococcal Polysaccharide-23 03/14/2021  ? Tdap 11/14/2020  ? ? ?  TDAP status: Up to date ? ?Flu Vaccine status: Up to date ? ?Pneumococcal vaccine status: Due, Education has been provided regarding the importance of this vaccine. Advised may receive this vaccine at local pharmacy or Health Dept. Aware to provide a copy of the vaccination record if obtained from local pharmacy or Health Dept. Verbalized acceptance and understanding. ? ?Covid-19 vaccine status: Information provided on how to obtain vaccines.  ? ?Qualifies for Shingles Vaccine? Yes   ?Zostavax completed No   ?Shingrix Completed?: No.    Education has been provided regarding the importance of this vaccine. Patient has been advised to call insurance company to determine out of pocket expense if they have not yet received this vaccine. Advised may also receive vaccine at local pharmacy or Health Dept. Verbalized acceptance and understanding. ? ?Screening Tests ?Health Maintenance  ?Topic Date Due  ? Hepatitis C Screening  Never done  ? COLONOSCOPY (Pts 45-24yrs Insurance coverage will need to be confirmed)  Never done  ? Zoster Vaccines- Shingrix (1 of 2) Never done  ? COVID-19 Vaccine (4 - Booster for Pfizer series) 03/11/2021  ? Pneumonia Vaccine 83+ Years old (2 - PCV) 03/14/2022  ? TETANUS/TDAP  11/14/2030  ? INFLUENZA VACCINE  Completed  ? HPV VACCINES  Aged Out  ? ? ?Health Maintenance ? ?Health Maintenance Due  ?Topic Date Due  ? Hepatitis C Screening  Never done  ? COLONOSCOPY (Pts 45-34yrs Insurance coverage will need to be confirmed)  Never done  ? Zoster Vaccines- Shingrix (1 of 2) Never done  ? COVID-19 Vaccine (4 - Booster for Pfizer series) 03/11/2021  ? Pneumonia Vaccine 17+ Years old (2 - PCV)  03/14/2022  ? ? ?Colorectal cancer screening: Referral to GI placed patient and provider notified. Pt aware the office will call re: appt. ? ?Lung Cancer Screening: (Low Dose CT Chest recommended if Age 5-80 year

## 2022-01-09 ENCOUNTER — Other Ambulatory Visit (INDEPENDENT_AMBULATORY_CARE_PROVIDER_SITE_OTHER): Payer: Self-pay | Admitting: Primary Care

## 2022-01-09 DIAGNOSIS — K219 Gastro-esophageal reflux disease without esophagitis: Secondary | ICD-10-CM

## 2022-01-09 DIAGNOSIS — Z760 Encounter for issue of repeat prescription: Secondary | ICD-10-CM

## 2022-01-09 NOTE — Telephone Encounter (Signed)
Sent to PCP ?

## 2022-01-23 ENCOUNTER — Other Ambulatory Visit: Payer: Self-pay | Admitting: Neurology

## 2022-01-23 ENCOUNTER — Other Ambulatory Visit (INDEPENDENT_AMBULATORY_CARE_PROVIDER_SITE_OTHER): Payer: Self-pay | Admitting: Primary Care

## 2022-01-23 DIAGNOSIS — Z760 Encounter for issue of repeat prescription: Secondary | ICD-10-CM

## 2022-01-23 DIAGNOSIS — K219 Gastro-esophageal reflux disease without esophagitis: Secondary | ICD-10-CM

## 2022-01-23 NOTE — Telephone Encounter (Signed)
Pt. Has appointment in May. ?Requested Prescriptions  ?Pending Prescriptions Disp Refills  ?? pantoprazole (PROTONIX) 40 MG tablet [Pharmacy Med Name: pantoprazole 40 mg tablet,delayed release] 30 tablet 0  ?  Sig: TAKE 1 Tablet BY MOUTH ONCE DAILY  ?  ? Gastroenterology: Proton Pump Inhibitors Passed - 01/23/2022  1:07 PM  ?  ?  Passed - Valid encounter within last 12 months  ?  Recent Outpatient Visits   ?      ? 7 months ago Need for immunization against influenza  ? [facility] Kerin Perna, NP  ? 10 months ago Prediabetes  ? [facility] Kerin Perna, NP  ? 1 year ago Essential hypertension  ? [facility] Kerin Perna, NP  ? 1 year ago Need for Tdap vaccination  ? [facility] Kerin Perna, NP  ? 1 year ago Essential hypertension  ? [facility] Kerin Perna, NP  ?  ?  ?Future Appointments   ?        ? In 4 weeks Kerin Perna, NP [place]  ?  ? ?  ?  ?  ?? atorvastatin (LIPITOR) 40 MG tablet [Pharmacy Med Name: atorvastatin 40 mg tablet] 30 tablet 0  ?  Sig: TAKE ONE TABLET BY MOUTH DAILY  ?  ? Cardiovascular:  Antilipid - Statins Failed - 01/23/2022  1:07 PM  ?  ?  Failed - Lipid Panel in normal range within the last 12 months  ?  Cholesterol, Total  ?Date Value Ref Range Status  ?06/14/2021 140 100 - 199 mg/dL Final  ? ?LDL Chol Calc (NIH)  ?Date Value Ref Range Status  ?06/14/2021 81 0 - 99 mg/dL Final  ? ?HDL  ?Date Value Ref Range Status  ?06/14/2021 40 >39 mg/dL Final  ? ?Triglycerides  ?Date Value Ref Range Status  ?06/14/2021 102 0 - 149 mg/dL Final  ? ?  ?  ?  Passed - Patient is not pregnant  ?  ?  Passed - Valid encounter within last 12 months  ?  Recent Outpatient Visits   ?      ? 7 months ago Need for immunization against influenza  ? [facility] Kerin Perna, NP  ? 10 months ago Prediabetes   ? [facility] Kerin Perna, NP  ? 1 year ago Essential hypertension  ? [facility] Kerin Perna, NP  ? 1 year ago Need for Tdap vaccination  ? [facility] Kerin Perna, NP  ? 1 year ago Essential hypertension  ? [facility] Kerin Perna, NP  ?  ?  ?Future Appointments   ?        ? In 4 weeks Kerin Perna, NP [place]  ?  ? ?  ?  ?  ?? clopidogrel (PLAVIX) 75 MG tablet [Pharmacy Med Name: clopidogrel 75 mg tablet] 30 tablet 0  ?  Sig: TAKE ONE TABLET BY MOUTH EVERY DAY  ?  ? Hematology: Antiplatelets - clopidogrel Failed - 01/23/2022  1:07 PM  ?  ?  Failed - HCT in normal range and within 180 days  ?  Hematocrit  ?Date Value Ref Range Status  ?06/14/2021 40.5 37.5 - 51.0 % Final  ?   ?  ?  Failed - HGB in normal range and within 180 days  ?  Hemoglobin  ?Date Value Ref Range Status  ?06/14/2021 13.1 13.0 - 17.7 g/dL Final  ?   ?  ?  Failed - PLT in normal range and within 180 days  ?  Platelets  ?Date Value Ref Range Status  ?06/14/2021 218 150 - 450 x10E3/uL Final  ?   ?  ?  Failed - Valid encounter within last 6 months  ?  Recent Outpatient Visits   ?      ? 7 months ago Need for immunization against influenza  ? [facility] Kerin Perna, NP  ? 10 months ago Prediabetes  ? [facility] Kerin Perna, NP  ? 1 year ago Essential hypertension  ? [facility] Kerin Perna, NP  ? 1 year ago Need for Tdap vaccination  ? [facility] Kerin Perna, NP  ? 1 year ago Essential hypertension  ? [facility] Kerin Perna, NP  ?  ?  ?Future Appointments   ?        ? In 4 weeks Kerin Perna, NP [place]  ?  ? ?  ?  ?  Passed - Cr in normal range and within 360 days  ?  Creatinine, Ser  ?Date Value Ref  Range Status  ?06/14/2021 1.14 0.76 - 1.27 mg/dL Final  ?   ?  ?  ? ?

## 2022-02-15 ENCOUNTER — Other Ambulatory Visit (INDEPENDENT_AMBULATORY_CARE_PROVIDER_SITE_OTHER): Payer: Self-pay | Admitting: Primary Care

## 2022-02-15 DIAGNOSIS — Z760 Encounter for issue of repeat prescription: Secondary | ICD-10-CM

## 2022-02-15 DIAGNOSIS — K219 Gastro-esophageal reflux disease without esophagitis: Secondary | ICD-10-CM

## 2022-02-15 NOTE — Telephone Encounter (Signed)
Appointment scheduled 02/21/22- RF per protocol ?Requested Prescriptions  ?Pending Prescriptions Disp Refills  ?? atorvastatin (LIPITOR) 40 MG tablet [Pharmacy Med Name: atorvastatin 40 mg tablet] 30 tablet 0  ?  Sig: TAKE 1 Tablet BY MOUTH ONCE DAILY  ?  ? Cardiovascular:  Antilipid - Statins Failed - 02/15/2022 12:35 PM  ?  ?  Failed - Lipid Panel in normal range within the last 12 months  ?  Cholesterol, Total  ?Date Value Ref Range Status  ?06/14/2021 140 100 - 199 mg/dL Final  ? ?LDL Chol Calc (NIH)  ?Date Value Ref Range Status  ?06/14/2021 81 0 - 99 mg/dL Final  ? ?HDL  ?Date Value Ref Range Status  ?06/14/2021 40 >39 mg/dL Final  ? ?Triglycerides  ?Date Value Ref Range Status  ?06/14/2021 102 0 - 149 mg/dL Final  ? ?  ?  ?  Passed - Patient is not pregnant  ?  ?  Passed - Valid encounter within last 12 months  ?  Recent Outpatient Visits   ?      ? 8 months ago Need for immunization against influenza  ? [facility] Grayce Sessions, NP  ? 11 months ago Prediabetes  ? [facility] Grayce Sessions, NP  ? 1 year ago Essential hypertension  ? [facility] Grayce Sessions, NP  ? 1 year ago Need for Tdap vaccination  ? [facility] Grayce Sessions, NP  ? 1 year ago Essential hypertension  ? [facility] Grayce Sessions, NP  ?  ?  ?Future Appointments   ?        ? In 6 days Grayce Sessions, NP [facility]  ?  ? ?  ?  ?  ?? clopidogrel (PLAVIX) 75 MG tablet [Pharmacy Med Name: clopidogrel 75 mg tablet] 30 tablet 0  ?  Sig: TAKE ONE TABLET BY MOUTH EVERY DAY  ?  ? Hematology: Antiplatelets - clopidogrel Failed - 02/15/2022 12:35 PM  ?  ?  Failed - HCT in normal range and within 180 days  ?  Hematocrit  ?Date Value Ref Range Status  ?06/14/2021 40.5 37.5 - 51.0 % Final  ?   ?  ?  Failed - HGB in normal range and within 180 days  ?  Hemoglobin  ?Date Value Ref Range  Status  ?06/14/2021 13.1 13.0 - 17.7 g/dL Final  ?   ?  ?  Failed - PLT in normal range and within 180 days  ?  Platelets  ?Date Value Ref Range Status  ?06/14/2021 218 150 - 450 x10E3/uL Final  ?   ?  ?  Failed - Valid encounter within last 6 months  ?  Recent Outpatient Visits   ?      ? 8 months ago Need for immunization against influenza  ? [facility] Grayce Sessions, NP  ? 11 months ago Prediabetes  ? [facility] Grayce Sessions, NP  ? 1 year ago Essential hypertension  ? [facility] Grayce Sessions, NP  ? 1 year ago Need for Tdap vaccination  ? [facility] Grayce Sessions, NP  ? 1 year ago Essential hypertension  ? [facility] Grayce Sessions, NP  ?  ?  ?Future Appointments   ?        ?  In 6 days Grayce Sessions, NP [facility]  ?  ? ?  ?  ?  Passed - Cr in normal range and within 360 days  ?  Creatinine, Ser  ?Date Value Ref Range Status  ?06/14/2021 1.14 0.76 - 1.27 mg/dL Final  ?   ?  ?  ?? pantoprazole (PROTONIX) 40 MG tablet [Pharmacy Med Name: pantoprazole 40 mg tablet,delayed release] 30 tablet 0  ?  Sig: TAKE 1 Tablet BY MOUTH ONCE DAILY  ?  ? Gastroenterology: Proton Pump Inhibitors Passed - 02/15/2022 12:35 PM  ?  ?  Passed - Valid encounter within last 12 months  ?  Recent Outpatient Visits   ?      ? 8 months ago Need for immunization against influenza  ? [facility] Grayce Sessions, NP  ? 11 months ago Prediabetes  ? [facility] Grayce Sessions, NP  ? 1 year ago Essential hypertension  ? [facility] Grayce Sessions, NP  ? 1 year ago Need for Tdap vaccination  ? [facility] Grayce Sessions, NP  ? 1 year ago Essential hypertension  ? [facility] Grayce Sessions, NP  ?  ?  ?Future Appointments   ?        ? In  6 days Grayce Sessions, NP [facility]  ?  ? ?  ?  ?  ? ?

## 2022-02-21 ENCOUNTER — Ambulatory Visit (INDEPENDENT_AMBULATORY_CARE_PROVIDER_SITE_OTHER): Payer: Medicare Other | Admitting: Primary Care

## 2022-02-22 ENCOUNTER — Other Ambulatory Visit (INDEPENDENT_AMBULATORY_CARE_PROVIDER_SITE_OTHER): Payer: Self-pay | Admitting: Primary Care

## 2022-02-22 ENCOUNTER — Ambulatory Visit (INDEPENDENT_AMBULATORY_CARE_PROVIDER_SITE_OTHER): Payer: Self-pay | Admitting: *Deleted

## 2022-02-22 NOTE — Telephone Encounter (Signed)
Opened in error

## 2022-02-22 NOTE — Telephone Encounter (Signed)
Pharmacy request 90 day supply. Sent per visit protocol for this medication Requested Prescriptions  Pending Prescriptions Disp Refills  . atorvastatin (LIPITOR) 40 MG tablet [Pharmacy Med Name: atorvastatin 40 mg tablet] 90 tablet 0    Sig: TAKE 1 Tablet BY MOUTH ONCE DAILY     Cardiovascular:  Antilipid - Statins Failed - 02/22/2022  9:36 AM      Failed - Lipid Panel in normal range within the last 12 months    Cholesterol, Total  Date Value Ref Range Status  06/14/2021 140 100 - 199 mg/dL Final   LDL Chol Calc (NIH)  Date Value Ref Range Status  06/14/2021 81 0 - 99 mg/dL Final   HDL  Date Value Ref Range Status  06/14/2021 40 >39 mg/dL Final   Triglycerides  Date Value Ref Range Status  06/14/2021 102 0 - 149 mg/dL Final         Passed - Patient is not pregnant      Passed - Valid encounter within last 12 months    Recent Outpatient Visits          8 months ago Need for immunization against influenza   [facility] Grayce Sessions, NP   11 months ago Prediabetes   [facility] Grayce Sessions, NP   1 year ago Essential hypertension   [facility] Grayce Sessions, NP   1 year ago Need for Tdap vaccination   [facility] Grayce Sessions, NP   1 year ago Essential hypertension   [facility] Grayce Sessions, NP      Future Appointments            In 3 weeks Randa Evens Kinnie Scales, NP [facility]

## 2022-03-16 ENCOUNTER — Ambulatory Visit (INDEPENDENT_AMBULATORY_CARE_PROVIDER_SITE_OTHER): Payer: Medicare Other | Admitting: Primary Care

## 2022-03-16 ENCOUNTER — Encounter (INDEPENDENT_AMBULATORY_CARE_PROVIDER_SITE_OTHER): Payer: Self-pay | Admitting: Primary Care

## 2022-03-16 VITALS — BP 109/75 | HR 95 | Temp 98.0°F | Ht 63.0 in | Wt 172.4 lb

## 2022-03-16 DIAGNOSIS — Z760 Encounter for issue of repeat prescription: Secondary | ICD-10-CM

## 2022-03-16 DIAGNOSIS — E559 Vitamin D deficiency, unspecified: Secondary | ICD-10-CM | POA: Diagnosis not present

## 2022-03-16 DIAGNOSIS — E785 Hyperlipidemia, unspecified: Secondary | ICD-10-CM

## 2022-03-16 DIAGNOSIS — I10 Essential (primary) hypertension: Secondary | ICD-10-CM

## 2022-03-16 MED ORDER — AMLODIPINE BESYLATE 5 MG PO TABS: 5.0000 mg | ORAL_TABLET | Freq: Every day | ORAL | 1 refills | Status: DC

## 2022-03-17 LAB — COMPREHENSIVE METABOLIC PANEL
ALT: 14 IU/L (ref 0–44)
AST: 25 IU/L (ref 0–40)
Albumin/Globulin Ratio: 1.4 (ref 1.2–2.2)
Albumin: 4.4 g/dL (ref 3.8–4.8)
Alkaline Phosphatase: 91 IU/L (ref 44–121)
BUN/Creatinine Ratio: 9 — ABNORMAL LOW (ref 10–24)
BUN: 10 mg/dL (ref 8–27)
Bilirubin Total: 0.7 mg/dL (ref 0.0–1.2)
CO2: 22 mmol/L (ref 20–29)
Calcium: 9.4 mg/dL (ref 8.6–10.2)
Chloride: 100 mmol/L (ref 96–106)
Creatinine, Ser: 1.09 mg/dL (ref 0.76–1.27)
Globulin, Total: 3.1 g/dL (ref 1.5–4.5)
Glucose: 97 mg/dL (ref 70–99)
Potassium: 4.7 mmol/L (ref 3.5–5.2)
Sodium: 141 mmol/L (ref 134–144)
Total Protein: 7.5 g/dL (ref 6.0–8.5)
eGFR: 75 mL/min/{1.73_m2} (ref >59)

## 2022-03-17 LAB — CBC WITH DIFFERENTIAL/PLATELET
Basophils Absolute: 0 10*3/uL (ref 0.0–0.2)
Basos: 0 %
EOS (ABSOLUTE): 0.1 10*3/uL (ref 0.0–0.4)
Eos: 1 %
Hematocrit: 40.5 % (ref 37.5–51.0)
Hemoglobin: 13.5 g/dL (ref 13.0–17.7)
Immature Grans (Abs): 0 10*3/uL (ref 0.0–0.1)
Immature Granulocytes: 0 %
Lymphocytes Absolute: 1.4 10*3/uL (ref 0.7–3.1)
Lymphs: 18 %
MCH: 24 pg — ABNORMAL LOW (ref 26.6–33.0)
MCHC: 33.3 g/dL (ref 31.5–35.7)
MCV: 72 fL — ABNORMAL LOW (ref 79–97)
Monocytes Absolute: 0.4 10*3/uL (ref 0.1–0.9)
Monocytes: 5 %
Neutrophils Absolute: 5.8 10*3/uL (ref 1.4–7.0)
Neutrophils: 76 %
Platelets: 211 10*3/uL (ref 150–450)
RBC: 5.62 x10E6/uL (ref 4.14–5.80)
RDW: 16.6 % — ABNORMAL HIGH (ref 11.6–15.4)
WBC: 7.7 10*3/uL (ref 3.4–10.8)

## 2022-03-17 LAB — LIPID PANEL
Chol/HDL Ratio: 2.7 ratio (ref 0.0–5.0)
Cholesterol, Total: 124 mg/dL (ref 100–199)
HDL: 46 mg/dL (ref >39)
LDL Chol Calc (NIH): 64 mg/dL (ref 0–99)
Triglycerides: 70 mg/dL (ref 0–149)
VLDL Cholesterol Cal: 14 mg/dL (ref 5–40)

## 2022-03-17 LAB — VITAMIN D 25 HYDROXY (VIT D DEFICIENCY, FRACTURES): Vit D, 25-Hydroxy: 68.3 ng/mL (ref 30.0–100.0)

## 2022-03-20 ENCOUNTER — Other Ambulatory Visit (INDEPENDENT_AMBULATORY_CARE_PROVIDER_SITE_OTHER): Payer: Self-pay | Admitting: Primary Care

## 2022-03-20 DIAGNOSIS — I10 Essential (primary) hypertension: Secondary | ICD-10-CM

## 2022-03-20 DIAGNOSIS — K219 Gastro-esophageal reflux disease without esophagitis: Secondary | ICD-10-CM

## 2022-03-20 DIAGNOSIS — Z760 Encounter for issue of repeat prescription: Secondary | ICD-10-CM

## 2022-03-20 NOTE — Telephone Encounter (Signed)
Routed to PCP 

## 2022-03-22 ENCOUNTER — Encounter (INDEPENDENT_AMBULATORY_CARE_PROVIDER_SITE_OTHER): Payer: Self-pay

## 2022-03-24 NOTE — Progress Notes (Signed)
[facility]   Mr.Richard Myers is a 66 y.o. male presents for hypertension evaluation, Denies shortness of breath, headaches, chest pain or lower extremity edema, sudden onset, vision changes, unilateral weakness, dizziness, paresthesias   Patient reports adherence with medications.  Dietary habits include: monitors sodium Exercise habits include:walking Family / Social history: No   Past Medical History:  Diagnosis Date   GERD (gastroesophageal reflux disease)    Hyperlipemia    Hypertension    Stroke [facility])    History reviewed. No pertinent surgical history. No Known Allergies Current Outpatient Medications on File Prior to Visit  Medication Sig Dispense Refill   atorvastatin (LIPITOR) 40 MG tablet TAKE 1 Tablet BY MOUTH ONCE DAILY 90 tablet 0   Cholecalciferol (VITAMIN D3) 50 MCG (2000 UT) capsule TAKE ONE TABLET BY MOUTH EVERY DAY 200 capsule 0   Iron, Ferrous Sulfate, 325 (65 Fe) MG TABS Take by mouth.     Multiple Vitamin (MULTI-VITAMIN DAILY PO) Take 1 tablet by mouth daily. (Patient not taking: Reported on 01/05/2022)     No current facility-administered medications on file prior to visit.   Social History   Socioeconomic History   Marital status: Single    Spouse name: Not on file   Number of children: Not on file   Years of education: Not on file   Highest education level: Not on file  Occupational History   Not on file  Tobacco Use   Smoking status: Never   Smokeless tobacco: Never  Substance and Sexual Activity   Alcohol use: Not Currently    Comment: varies in amount   Drug use: No   Sexual activity: Yes  Other Topics Concern   Not on file  Social History Narrative   Not on file   Social Determinants of Health   Financial Resource Strain: Low Risk  (01/05/2022)   Overall Financial Resource Strain (CARDIA)    Difficulty of Paying Living Expenses: Not hard at all  Food Insecurity: No Food Insecurity (01/05/2022)   Hunger Vital  Sign    Worried About Running Out of Food in the Last Year: Never true    Ran Out of Food in the Last Year: Never true  Transportation Needs: No Transportation Needs (01/05/2022)   PRAPARE - Administrator, Civil Service (Medical): No    Lack of Transportation (Non-Medical): No  Physical Activity: Inactive (01/05/2022)   Exercise Vital Sign    Days of Exercise per Week: 0 days    Minutes of Exercise per Session: 0 min  Stress: No Stress Concern Present (01/05/2022)   [employer] of Occupational Health - Occupational Stress Questionnaire    Feeling of Stress : Not at all  Social Connections: Moderately Isolated (01/05/2022)   Social Connection and Isolation Panel [NHANES]    Frequency of Communication with Friends and Family: More than three times a week    Frequency of Social Gatherings with Friends and Family: Twice a week    Attends Religious Services: More than 4 times per year    Active Member of [employer] or Organizations: No    Attends Banker Meetings: Never    Marital Status: Never married  Intimate Partner Violence: Not At Risk (01/05/2022)   Humiliation, Afraid, Rape, and Kick questionnaire    Fear of Current or Ex-Partner: No    Emotionally Abused: No    Physically Abused: No    Sexually Abused: No   Family History  Problem Relation Age of Onset  Arthritis Mother    Diabetes Mother    Hypertension Mother    Alcohol abuse Father      OBJECTIVE:  Vitals:   03/16/22 1055  BP: 109/75  Pulse: 95  Temp: 98 F (36.7 C)  TempSrc: Oral  SpO2: 95%  Weight: 172 lb 6.4 oz (78.2 kg)  Height: 5\' 3"  (1.6 m)    Physical Exam HENT:     Head: Normocephalic.     Right Ear: Tympanic membrane and external ear normal.     Left Ear: Tympanic membrane and external ear normal.     Nose: Nose normal.  Eyes:     Extraocular Movements: Extraocular movements intact.     Conjunctiva/sclera: Conjunctivae normal.     Pupils: Pupils are equal, round, and  reactive to light.  Cardiovascular:     Rate and Rhythm: Normal rate and regular rhythm.  Pulmonary:     Effort: Pulmonary effort is normal.     Breath sounds: Normal breath sounds.  Abdominal:     General: Bowel sounds are normal.     Palpations: Abdomen is soft.  Musculoskeletal:     Cervical back: Normal range of motion.  Skin:    General: Skin is warm and dry.  Neurological:     Mental Status: He is alert and oriented to person, place, and time.  Psychiatric:        Mood and Affect: Mood normal.        Behavior: Behavior normal.        Thought Content: Thought content normal.        Judgment: Judgment normal.    ROS Comprehensive ROS Pertinent positive and negative noted in HPI   Last 3 Office BP readings: BP Readings from Last 3 Encounters:  03/16/22 109/75  10/24/21 (!) 144/99  06/14/21 108/72    BMET    Component Value Date/Time   NA 141 03/16/2022 1114   K 4.7 03/16/2022 1114   CL 100 03/16/2022 1114   CO2 22 03/16/2022 1114   GLUCOSE 97 03/16/2022 1114   GLUCOSE 94 05/11/2021 0911   BUN 10 03/16/2022 1114   CREATININE 1.09 03/16/2022 1114   CALCIUM 9.4 03/16/2022 1114   GFRNONAA 73 11/14/2020 1140   GFRNONAA >60 08/09/2020 0435   GFRAA 85 11/14/2020 1140    Renal function: Estimated Creatinine Clearance: 61.7 mL/min (by C-G formula based on SCr of 1.09 mg/dL).  Clinical ASCVD: Yes  The ASCVD Risk score (Arnett DK, et al., 2019) failed to calculate for the following reasons:   The patient has a prior MI or stroke diagnosis  ASCVD risk factors include- 2020   ASSESSMENT & PLAN: Rafiel was seen today for hypertension.  Diagnoses and all orders for this visit:  Essential hypertension resolved -     Discontinue: amLODipine (NORVASC) 5 MG tablet; Take 1 tablet (5 mg total) by mouth daily. -     Comprehensive metabolic panel -     CBC with Differential/Platelet  Dyslipidemia  Healthy lifestyle diet of fruits vegetables fish nuts whole grains  and low saturated fat . Foods high in cholesterol or liver, fatty meats,cheese, butter avocados, nuts and seeds, chocolate and fried foods. -     Lipid Panel  Vitamin D deficiency Vitamin D is needed to make and keep bones strong. The patient will need to take a prescription strength vitamin D tablet once weekly until next appointment.  Vitamin D level will be rechecked at future visit.  I have sent the  vitamin D tablet to the pharmacy and it should be ready for pick up.  Please remind patient of upcoming appointments and/or schedule for follow-up if needed in 6-8 weeks.  -     VITAMIN D 25 Hydroxy (Vit-D Deficiency, Fractures)     Meds ordered this encounter  Medications   DISCONTD: amLODipine (NORVASC) 5 MG tablet    Sig: Take 1 tablet (5 mg total) by mouth daily.    Dispense:  90 tablet    Refill:  1     This note has been created with Education officer, environmental. Any transcriptional errors are unintentional.   Grayce Sessions, NP 03/24/2022, 4:15 PM

## 2022-03-26 ENCOUNTER — Other Ambulatory Visit (INDEPENDENT_AMBULATORY_CARE_PROVIDER_SITE_OTHER): Payer: Self-pay | Admitting: Primary Care

## 2022-03-26 NOTE — Telephone Encounter (Signed)
Insurance requesting 90day supply

## 2022-04-12 ENCOUNTER — Other Ambulatory Visit (INDEPENDENT_AMBULATORY_CARE_PROVIDER_SITE_OTHER): Payer: Self-pay | Admitting: Primary Care

## 2022-05-01 NOTE — Telephone Encounter (Signed)
error 

## 2022-05-02 ENCOUNTER — Other Ambulatory Visit (INDEPENDENT_AMBULATORY_CARE_PROVIDER_SITE_OTHER): Payer: Self-pay | Admitting: Primary Care

## 2022-05-02 DIAGNOSIS — K219 Gastro-esophageal reflux disease without esophagitis: Secondary | ICD-10-CM

## 2022-05-02 DIAGNOSIS — Z760 Encounter for issue of repeat prescription: Secondary | ICD-10-CM

## 2022-05-28 IMAGING — DX DG ABDOMEN 2V
2 series · 2 of 2 positions shown · non-contrast
Comparison: None.

CLINICAL DATA: Abdominal pain, distension

EXAM:
ABDOMEN - 2 VIEW

[abdomen erect]
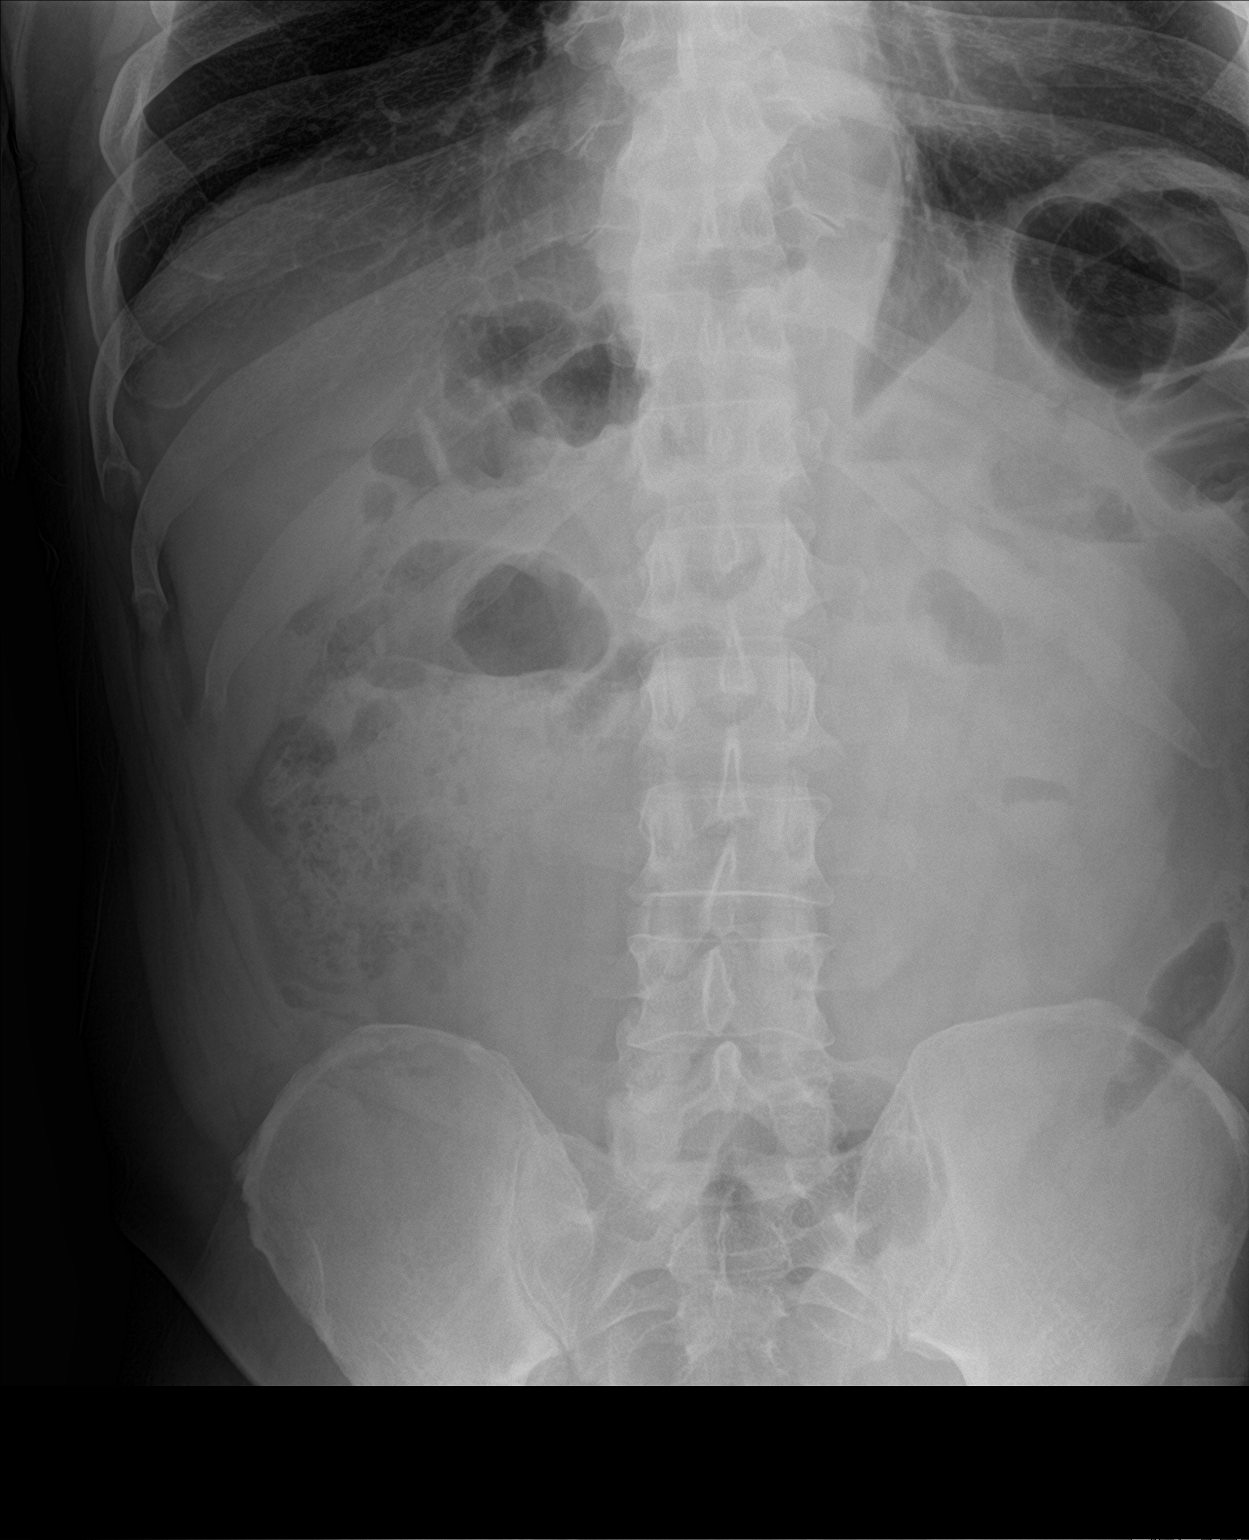

[abdomen supine]
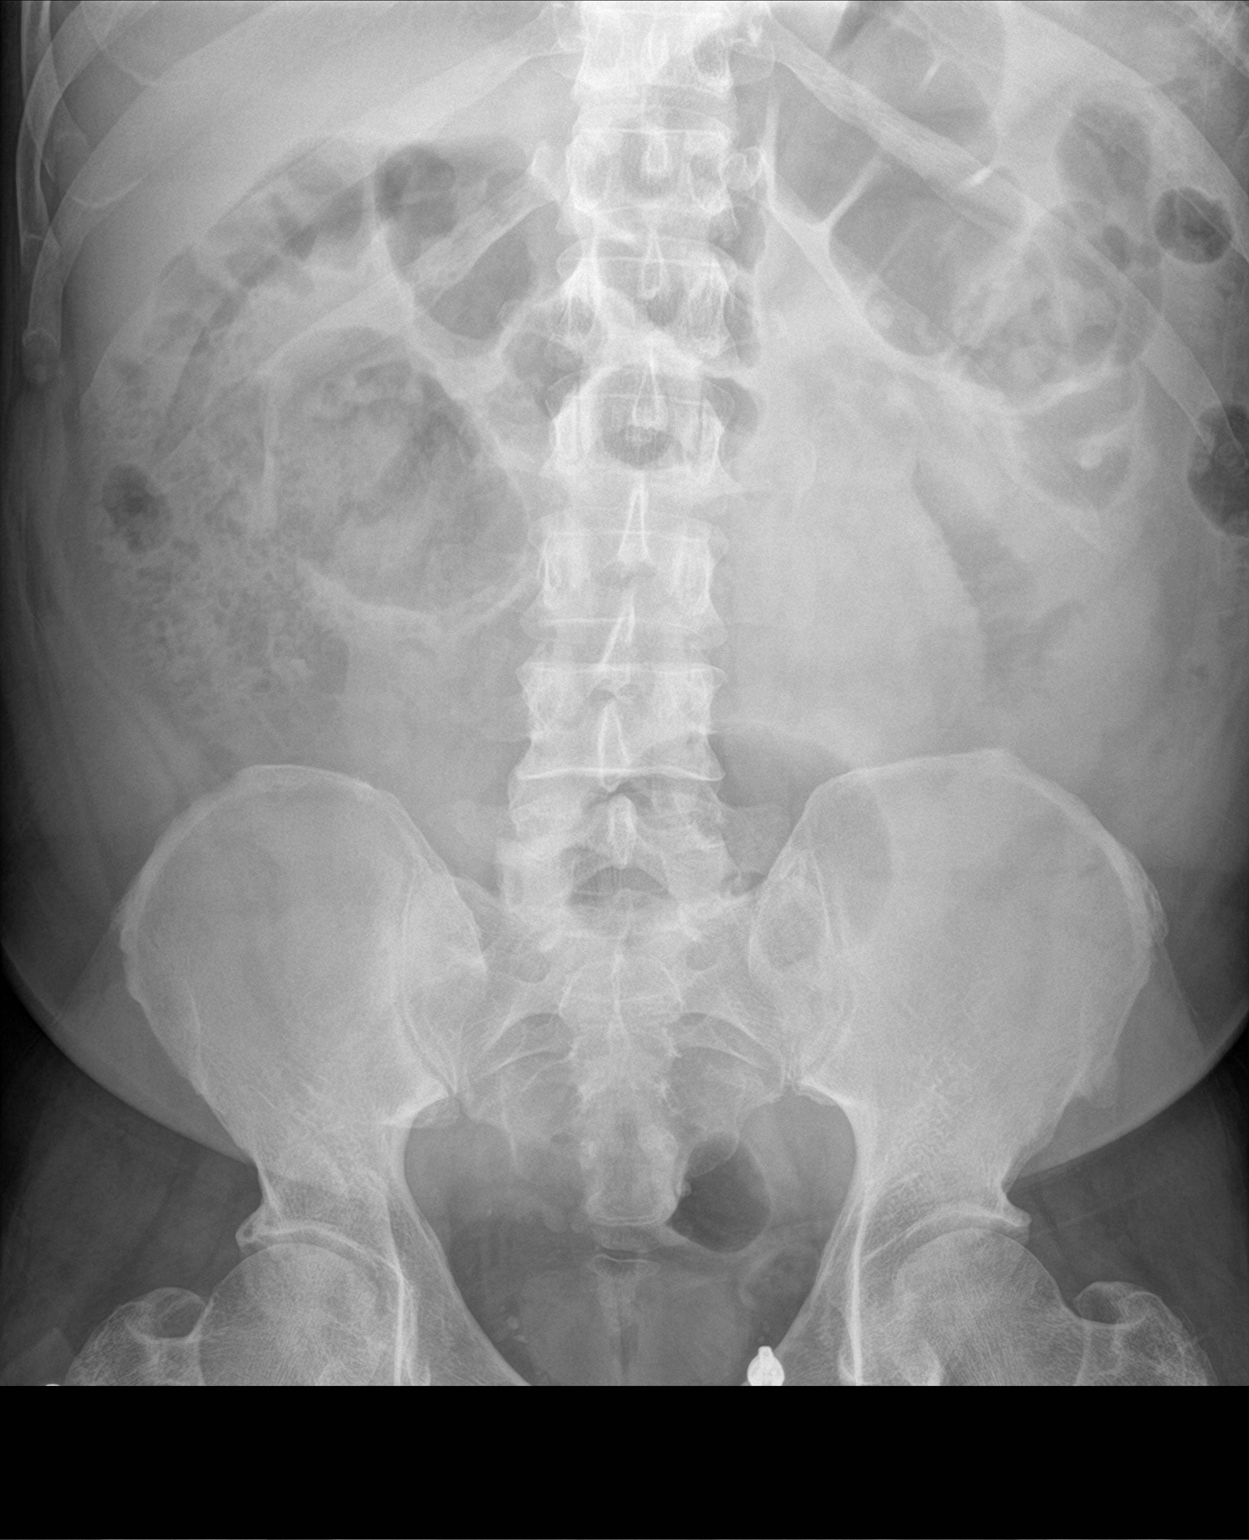

[2 of 2 positions shown; findings below may reference images not displayed]

FINDINGS: Unremarkable bowel gas pattern. Mild stool burden. No free air. No
calculi. No acute osseous abnormality.
IMPRESSION: Unremarkable bowel gas pattern.

## 2022-06-19 ENCOUNTER — Ambulatory Visit (INDEPENDENT_AMBULATORY_CARE_PROVIDER_SITE_OTHER): Payer: Medicare Other | Admitting: Primary Care

## 2022-07-11 ENCOUNTER — Other Ambulatory Visit (INDEPENDENT_AMBULATORY_CARE_PROVIDER_SITE_OTHER): Payer: Self-pay | Admitting: Primary Care

## 2022-07-11 NOTE — Telephone Encounter (Signed)
Requested Prescriptions  Pending Prescriptions Disp Refills  . atorvastatin (LIPITOR) 40 MG tablet [Pharmacy Med Name: atorvastatin 40 mg tablet] 90 tablet 2    Sig: TAKE 1 Tablet BY MOUTH ONCE DAILY     Cardiovascular:  Antilipid - Statins Failed - 07/11/2022  4:19 PM      Failed - Lipid Panel in normal range within the last 12 months    Cholesterol, Total  Date Value Ref Range Status  03/16/2022 124 100 - 199 mg/dL Final   LDL Chol Calc (NIH)  Date Value Ref Range Status  03/16/2022 64 0 - 99 mg/dL Final   HDL  Date Value Ref Range Status  03/16/2022 46 >39 mg/dL Final   Triglycerides  Date Value Ref Range Status  03/16/2022 70 0 - 149 mg/dL Final         Passed - Patient is not pregnant      Passed - Valid encounter within last 12 months    Recent Outpatient Visits          3 months ago Essential hypertension   [place], Michelle P, NP   1 year ago Need for immunization against influenza   [place], Michelle P, NP   1 year ago Prediabetes   [place] Kerin Perna, NP   1 year ago Essential hypertension   [place] Kerin Perna, NP   1 year ago Need for Tdap vaccination   [place] Kerin Perna, NP

## 2022-07-12 ENCOUNTER — Other Ambulatory Visit (INDEPENDENT_AMBULATORY_CARE_PROVIDER_SITE_OTHER): Payer: Self-pay | Admitting: Primary Care

## 2022-07-12 DIAGNOSIS — E559 Vitamin D deficiency, unspecified: Secondary | ICD-10-CM

## 2022-07-12 NOTE — Telephone Encounter (Signed)
Requested Prescriptions  Pending Prescriptions Disp Refills  . Cholecalciferol (VITAMIN D3) 50 MCG (2000 UT) capsule [Pharmacy Med Name: Vitamin D 2000iu Softgel ([employer])] 200 capsule 0    Sig: TAKE ONE TABLET BY MOUTH EVERY DAY     Endocrinology:  Vitamins - Vitamin D Supplementation 2 Failed - 07/12/2022  3:05 PM      Failed - Manual Review: Route requests for 50,000 IU strength to the provider      Passed - Ca in normal range and within 360 days    Calcium  Date Value Ref Range Status  03/16/2022 9.4 8.6 - 10.2 mg/dL Final   Calcium, Ion  Date Value Ref Range Status  05/11/2021 0.98 (L) 1.15 - 1.40 mmol/L Final         Passed - Vitamin D in normal range and within 360 days    Vit D, 25-Hydroxy  Date Value Ref Range Status  03/16/2022 68.3 30.0 - 100.0 ng/mL Final    Comment:    Vitamin D deficiency has been defined by the Institute of Medicine and an Endocrine Society practice guideline as a level of serum 25-OH vitamin D less than 20 ng/mL (1,2). The Endocrine Society went on to further define vitamin D insufficiency as a level between 21 and 29 ng/mL (2). 1. IOM (Institute of Medicine). 2010. Dietary reference    intakes for calcium and D. [place]: The    [employer]. 2. Holick MF, Binkley Ray, Bischoff-Ferrari HA, et al.    Evaluation, treatment, and prevention of vitamin D    deficiency: an Endocrine Society clinical practice    guideline. JCEM. 2011 Jul; 96(7):1911-30.          Passed - Valid encounter within last 12 months    Recent Outpatient Visits          3 months ago Essential hypertension   [place] Kerin Perna, NP   1 year ago Need for immunization against influenza   [place], [place], NP   1 year ago Prediabetes   [place] Kerin Perna, NP   1 year ago Essential hypertension   [place] Kerin Perna, NP   1 year ago Need for Tdap vaccination   [place] Kerin Perna, NP

## 2022-08-08 ENCOUNTER — Other Ambulatory Visit (INDEPENDENT_AMBULATORY_CARE_PROVIDER_SITE_OTHER): Payer: Self-pay | Admitting: Primary Care

## 2022-08-08 NOTE — Telephone Encounter (Signed)
Requested Prescriptions  Pending Prescriptions Disp Refills  . clopidogrel (PLAVIX) 75 MG tablet [Pharmacy Med Name: clopidogrel 75 mg tablet] 30 tablet 1    Sig: Richard Myers     Hematology: Antiplatelets - clopidogrel Passed - 08/08/2022 10:53 AM      Passed - HCT in normal range and within 180 days    Hematocrit  Date Value Ref Range Status  03/16/2022 40.5 37.5 - 51.0 % Final         Passed - HGB in normal range and within 180 days    Hemoglobin  Date Value Ref Range Status  03/16/2022 13.5 13.0 - 17.7 g/dL Final         Passed - PLT in normal range and within 180 days    Platelets  Date Value Ref Range Status  03/16/2022 211 150 - 450 x10E3/uL Final         Passed - Cr in normal range and within 360 days    Creatinine, Ser  Date Value Ref Range Status  03/16/2022 1.09 0.76 - 1.27 mg/dL Final         Passed - Valid encounter within last 6 months    Recent Outpatient Visits          4 months ago Essential hypertension   [place] Kerin Perna, NP   1 year ago Need for immunization against influenza   [place], Michelle P, NP   1 year ago Prediabetes   [place] Kerin Perna, NP   1 year ago Essential hypertension   [place] Kerin Perna, NP   1 year ago Need for Tdap vaccination   [place] Kerin Perna, NP

## 2022-09-26 ENCOUNTER — Other Ambulatory Visit (INDEPENDENT_AMBULATORY_CARE_PROVIDER_SITE_OTHER): Payer: Self-pay | Admitting: Primary Care

## 2022-09-26 DIAGNOSIS — K219 Gastro-esophageal reflux disease without esophagitis: Secondary | ICD-10-CM

## 2022-09-26 DIAGNOSIS — Z760 Encounter for issue of repeat prescription: Secondary | ICD-10-CM

## 2022-09-27 NOTE — Telephone Encounter (Signed)
Called pt - [id number] for appt.  

## 2022-09-27 NOTE — Telephone Encounter (Signed)
Requested Prescriptions  Pending Prescriptions Disp Refills   pantoprazole (PROTONIX) 40 MG tablet [Pharmacy Med Name: pantoprazole 40 mg tablet,delayed release] 90 tablet 1    Sig: TAKE 1 Tablet BY MOUTH ONCE DAILY     Gastroenterology: Proton Pump Inhibitors Passed - 09/26/2022  3:43 PM      Passed - Valid encounter within last 12 months    Recent Outpatient Visits           6 months ago Essential hypertension   [facility] Grayce Sessions, NP   1 year ago Need for immunization against influenza   [facility] Grayce Sessions, NP   1 year ago Prediabetes   [facility] Grayce Sessions, NP   1 year ago Essential hypertension   [facility] Grayce Sessions, NP   1 year ago Need for Tdap vaccination   [facility] Grayce Sessions, NP               clopidogrel (PLAVIX) 75 MG tablet [Pharmacy Med Name: clopidogrel 75 mg tablet] 30 tablet 3    Sig: TAKE ONE TABLET BY MOUTH EVERY DAY     Hematology: Antiplatelets - clopidogrel Failed - 09/26/2022  3:43 PM      Failed - HCT in normal range and within 180 days    Hematocrit  Date Value Ref Range Status  03/16/2022 40.5 37.5 - 51.0 % Final         Failed - HGB in normal range and within 180 days    Hemoglobin  Date Value Ref Range Status  03/16/2022 13.5 13.0 - 17.7 g/dL Final         Failed - PLT in normal range and within 180 days    Platelets  Date Value Ref Range Status  03/16/2022 211 150 - 450 x10E3/uL Final         Failed - Valid encounter within last 6 months    Recent Outpatient Visits           6 months ago Essential hypertension   [facility] Grayce Sessions, NP   1 year ago Need for immunization against influenza   [facility] Grayce Sessions, NP   1 year ago Prediabetes   [facility] Grayce Sessions, NP   1 year ago Essential hypertension   [facility] Grayce Sessions, NP   1 year ago Need for Tdap vaccination   [facility] Grayce Sessions, NP              Passed - Cr in normal range and within 360 days    Creatinine, Ser  Date Value Ref Range Status  03/16/2022 1.09 0.76 - 1.27 mg/dL Final

## 2022-09-27 NOTE — Telephone Encounter (Signed)
Courtesy refill. Patient will need an office visit for further refills. Requested Prescriptions  Pending Prescriptions Disp Refills   clopidogrel (PLAVIX) 75 MG tablet [Pharmacy Med Name: clopidogrel 75 mg tablet] 30 tablet 0    Sig: TAKE ONE TABLET BY MOUTH EVERY DAY     Hematology: Antiplatelets - clopidogrel Failed - 09/26/2022  3:43 PM      Failed - HCT in normal range and within 180 days    Hematocrit  Date Value Ref Range Status  03/16/2022 40.5 37.5 - 51.0 % Final         Failed - HGB in normal range and within 180 days    Hemoglobin  Date Value Ref Range Status  03/16/2022 13.5 13.0 - 17.7 g/dL Final         Failed - PLT in normal range and within 180 days    Platelets  Date Value Ref Range Status  03/16/2022 211 150 - 450 x10E3/uL Final         Failed - Valid encounter within last 6 months    Recent Outpatient Visits           6 months ago Essential hypertension   [facility] Grayce Sessions, NP   1 year ago Need for immunization against influenza   [facility] Grayce Sessions, NP   1 year ago Prediabetes   [facility] Grayce Sessions, NP   1 year ago Essential hypertension   [facility] Grayce Sessions, NP   1 year ago Need for Tdap vaccination   [facility] Grayce Sessions, NP              Passed - Cr in normal range and within 360 days    Creatinine, Ser  Date Value Ref Range Status  03/16/2022 1.09 0.76 - 1.27 mg/dL Final         Signed Prescriptions Disp Refills   pantoprazole (PROTONIX) 40 MG tablet 90 tablet 1    Sig: TAKE 1 Tablet BY MOUTH ONCE DAILY     Gastroenterology: Proton Pump Inhibitors Passed - 09/26/2022  3:43 PM      Passed - Valid encounter within last 12 months    Recent Outpatient Visits           6 months ago Essential hypertension   [facility] Grayce Sessions,  NP   1 year ago Need for immunization against influenza   [facility] Grayce Sessions, NP   1 year ago Prediabetes   [facility] Grayce Sessions, NP   1 year ago Essential hypertension   [facility] Grayce Sessions, NP   1 year ago Need for Tdap vaccination   [facility] Grayce Sessions, NP

## 2022-12-03 ENCOUNTER — Other Ambulatory Visit (INDEPENDENT_AMBULATORY_CARE_PROVIDER_SITE_OTHER): Payer: Self-pay | Admitting: Primary Care

## 2022-12-04 NOTE — Telephone Encounter (Signed)
Requested medication (s) are due for refill today: Yes  Requested medication (s) are on the active medication list: Yes  Last refill:  12/21/2/3  Future visit scheduled: No  Notes to clinic:  Left message to make appointment.    Requested Prescriptions  Pending Prescriptions Disp Refills   clopidogrel (PLAVIX) 75 MG tablet [Pharmacy Med Name: clopidogrel 75 mg tablet] 30 tablet 0    Sig: Amelia DAY     Hematology: Antiplatelets - clopidogrel Failed - 12/03/2022 10:51 AM      Failed - HCT in normal range and within 180 days    Hematocrit  Date Value Ref Range Status  03/16/2022 40.5 37.5 - 51.0 % Final         Failed - HGB in normal range and within 180 days    Hemoglobin  Date Value Ref Range Status  03/16/2022 13.5 13.0 - 17.7 g/dL Final         Failed - PLT in normal range and within 180 days    Platelets  Date Value Ref Range Status  03/16/2022 211 150 - 450 x10E3/uL Final         Failed - Valid encounter within last 6 months    Recent Outpatient Visits           8 months ago Essential hypertension   [place], Michelle P, NP   1 year ago Need for immunization against influenza   [place], Michelle P, NP   1 year ago Prediabetes   [place], Michelle P, NP   2 years ago Essential hypertension   [place], Michelle P, NP   2 years ago Need for Tdap vaccination   [place] [facility] Kerin Perna, NP              Passed - Cr in normal range and within 360 days    Creatinine, Ser  Date Value Ref Range Status  03/16/2022 1.09 0.76 - 1.27 mg/dL Final

## 2022-12-17 ENCOUNTER — Ambulatory Visit (INDEPENDENT_AMBULATORY_CARE_PROVIDER_SITE_OTHER): Payer: Medicare HMO | Admitting: Primary Care

## 2022-12-17 ENCOUNTER — Encounter: Payer: Self-pay | Admitting: Gastroenterology

## 2022-12-17 ENCOUNTER — Encounter (INDEPENDENT_AMBULATORY_CARE_PROVIDER_SITE_OTHER): Payer: Self-pay | Admitting: Primary Care

## 2022-12-17 ENCOUNTER — Other Ambulatory Visit (INDEPENDENT_AMBULATORY_CARE_PROVIDER_SITE_OTHER): Payer: Self-pay | Admitting: Primary Care

## 2022-12-17 VITALS — BP 106/73 | HR 90 | Resp 16 | Wt 183.8 lb

## 2022-12-17 DIAGNOSIS — Z1211 Encounter for screening for malignant neoplasm of colon: Secondary | ICD-10-CM

## 2022-12-17 DIAGNOSIS — R7303 Prediabetes: Secondary | ICD-10-CM

## 2022-12-17 DIAGNOSIS — I10 Essential (primary) hypertension: Secondary | ICD-10-CM | POA: Diagnosis not present

## 2022-12-17 DIAGNOSIS — E559 Vitamin D deficiency, unspecified: Secondary | ICD-10-CM | POA: Diagnosis not present

## 2022-12-17 DIAGNOSIS — R351 Nocturia: Secondary | ICD-10-CM

## 2022-12-17 DIAGNOSIS — E785 Hyperlipidemia, unspecified: Secondary | ICD-10-CM

## 2022-12-17 DIAGNOSIS — Z23 Encounter for immunization: Secondary | ICD-10-CM | POA: Diagnosis not present

## 2022-12-17 MED ORDER — CLOPIDOGREL BISULFATE 75 MG PO TABS: 75.0000 mg | ORAL_TABLET | Freq: Every day | ORAL | 1 refills | Status: DC

## 2022-12-17 MED ORDER — ZOSTER VAC RECOMB ADJUVANTED 50 MCG/0.5ML IM SUSR: 0.5000 mL | Freq: Once | INTRAMUSCULAR | 1 refills | Status: AC

## 2022-12-17 NOTE — Progress Notes (Signed)
Richard Myers, is a 67 y.o. male  [id number]  [id number]  DOB - 1956/04/01  No chief complaint on file.      Subjective:   Mr.Richard Myers is a 67 y.o. male here today for a follow up for HTN. Bp is unremarkable notes he has limited all salts in his diet. Patient has No headache, No chest pain, No abdominal pain - No Nausea, No new weakness tingling or numbness, No Cough - shortness of breath  No problems updated.  No Known Allergies  Past Medical History:  Diagnosis Date   GERD (gastroesophageal reflux disease)    Hyperlipemia    Hypertension    Stroke [facility])     Current Outpatient Medications on File Prior to Visit  Medication Sig Dispense Refill   amLODipine (NORVASC) 10 MG tablet TAKE ONE TABLET BY MOUTH EVERY DAY 90 tablet 3   atorvastatin (LIPITOR) 40 MG tablet TAKE 1 Tablet BY MOUTH ONCE DAILY 90 tablet 2   Cholecalciferol (VITAMIN D3) 50 MCG (2000 UT) capsule TAKE ONE TABLET BY MOUTH EVERY DAY 200 capsule 0   clopidogrel (PLAVIX) 75 MG tablet TAKE ONE TABLET BY MOUTH EVERY DAY 30 tablet 0   Iron, Ferrous Sulfate, 325 (65 Fe) MG TABS Take by mouth.     lisinopril-hydrochlorothiazide (ZESTORETIC) 20-25 MG tablet TAKE ONE TABLET BY MOUTH EVERY MORNING 90 tablet 3   Multiple Vitamin (MULTI-VITAMIN DAILY PO) Take 1 tablet by mouth daily. (Patient not taking: Reported on 01/05/2022)     pantoprazole (PROTONIX) 40 MG tablet TAKE 1 Tablet BY MOUTH ONCE DAILY 90 tablet 1   No current facility-administered medications on file prior to visit.    Objective:  Blood Pressure 106/73   Pulse 90   Respiration 16   Weight 183 lb 12.8 oz (83.4 kg)   Oxygen Saturation 98%   Body Mass Index 32.56 kg/m    Comprehensive ROS Pertinent positive and negative noted in HPI  Exam General appearance : Awake, alert, not in any distress. Speech Clear. Not toxic looking HEENT: Atraumatic and Normocephalic, pupils equally reactive to light and  accomodation Neck: Supple, no JVD. No cervical lymphadenopathy.  Chest: Good air entry bilaterally, no added sounds  CVS: S1 S2 regular, no murmurs.  Abdomen: Bowel sounds present, Non tender and not distended with no gaurding, rigidity or rebound. Extremities: B/L Lower Ext shows no edema, both legs are warm to touch Neurology: Awake alert, and oriented X 3,  intact, Non focal Skin: No Rash  Data Review Lab Results  Component Value Date   HGBA1C 5.9 (A) 03/14/2021   HGBA1C 6.2 (H) 08/08/2020    Assessment & Plan   Martravious was seen today for hypertension.  Diagnoses and all orders for this visit:  Colon cancer screening -     Ambulatory referral to Gastroenterology  Essential hypertension -     Comprehensive metabolic panel  Need for shingles vaccine -     Zoster Vaccine Adjuvanted [facility]) injection; Inject 0.5 mLs into the muscle once for 1 dose.  Nocturia -     PSA  Dyslipidemia -     Lipid Panel  Vitamin D deficiency -     VITAMIN D 25 Hydroxy (Vit-D Deficiency, Fractures)  Prediabetes -     Hemoglobin A1c -     CBC with Differential/Platelet    Patient have been counseled extensively about nutrition and exercise. Other issues discussed during this visit include: low cholesterol diet, weight control and daily exercise,  foot care, annual eye examinations at Ophthalmology, importance of adherence with medications and regular follow-up. We also discussed long term complications of uncontrolled diabetes and hypertension.   No follow-ups on file.  The patient was given clear instructions to go to ER or return to medical center if symptoms don't improve, worsen or new problems develop. The patient verbalized understanding. The patient was told to call to get lab results if they haven't heard anything in the next week.   This note has been created with Surveyor, quantity. Any transcriptional errors are unintentional.    Kerin Perna, NP 12/17/2022, 10:11 AM

## 2022-12-18 LAB — CBC WITH DIFFERENTIAL/PLATELET
Basophils Absolute: 0 10*3/uL (ref 0.0–0.2)
Basos: 1 %
EOS (ABSOLUTE): 0.2 10*3/uL (ref 0.0–0.4)
Eos: 5 %
Hematocrit: 44.2 % (ref 37.5–51.0)
Hemoglobin: 14.2 g/dL (ref 13.0–17.7)
Immature Grans (Abs): 0 10*3/uL (ref 0.0–0.1)
Immature Granulocytes: 0 %
Lymphocytes Absolute: 1.9 10*3/uL (ref 0.7–3.1)
Lymphs: 50 %
MCH: 23.6 pg — ABNORMAL LOW (ref 26.6–33.0)
MCHC: 32.1 g/dL (ref 31.5–35.7)
MCV: 73 fL — ABNORMAL LOW (ref 79–97)
Monocytes Absolute: 0.3 10*3/uL (ref 0.1–0.9)
Monocytes: 8 %
Neutrophils Absolute: 1.3 10*3/uL — ABNORMAL LOW (ref 1.4–7.0)
Neutrophils: 36 %
Platelets: 211 10*3/uL (ref 150–450)
RBC: 6.02 x10E6/uL — ABNORMAL HIGH (ref 4.14–5.80)
RDW: 17.9 % — ABNORMAL HIGH (ref 11.6–15.4)
WBC: 3.7 10*3/uL (ref 3.4–10.8)

## 2022-12-18 LAB — COMPREHENSIVE METABOLIC PANEL
ALT: 16 IU/L (ref 0–44)
AST: 18 IU/L (ref 0–40)
Albumin/Globulin Ratio: 1.5 (ref 1.2–2.2)
Albumin: 4.6 g/dL (ref 3.9–4.9)
Alkaline Phosphatase: 103 IU/L (ref 44–121)
BUN/Creatinine Ratio: 14 (ref 10–24)
BUN: 13 mg/dL (ref 8–27)
Bilirubin Total: 0.5 mg/dL (ref 0.0–1.2)
CO2: 23 mmol/L (ref 20–29)
Calcium: 9.4 mg/dL (ref 8.6–10.2)
Chloride: 101 mmol/L (ref 96–106)
Creatinine, Ser: 0.96 mg/dL (ref 0.76–1.27)
Globulin, Total: 3.1 g/dL (ref 1.5–4.5)
Glucose: 95 mg/dL (ref 70–99)
Potassium: 4.5 mmol/L (ref 3.5–5.2)
Sodium: 141 mmol/L (ref 134–144)
Total Protein: 7.7 g/dL (ref 6.0–8.5)
eGFR: 87 mL/min/{1.73_m2} (ref >59)

## 2022-12-18 LAB — LIPID PANEL
Chol/HDL Ratio: 2.9 ratio (ref 0.0–5.0)
Cholesterol, Total: 147 mg/dL (ref 100–199)
HDL: 50 mg/dL (ref >39)
LDL Chol Calc (NIH): 81 mg/dL (ref 0–99)
Triglycerides: 81 mg/dL (ref 0–149)
VLDL Cholesterol Cal: 16 mg/dL (ref 5–40)

## 2022-12-18 LAB — HEMOGLOBIN A1C
Est. average glucose Bld gHb Est-mCnc: 123 mg/dL
Hgb A1c MFr Bld: 5.9 % — ABNORMAL HIGH (ref 4.8–5.6)

## 2022-12-18 LAB — PSA: Prostate Specific Ag, Serum: 2.5 ng/mL (ref 0.0–4.0)

## 2022-12-18 LAB — VITAMIN D 25 HYDROXY (VIT D DEFICIENCY, FRACTURES): Vit D, 25-Hydroxy: 35.3 ng/mL (ref 30.0–100.0)

## 2022-12-19 ENCOUNTER — Other Ambulatory Visit (INDEPENDENT_AMBULATORY_CARE_PROVIDER_SITE_OTHER): Payer: Self-pay | Admitting: Primary Care

## 2022-12-19 DIAGNOSIS — K219 Gastro-esophageal reflux disease without esophagitis: Secondary | ICD-10-CM

## 2022-12-19 DIAGNOSIS — Z760 Encounter for issue of repeat prescription: Secondary | ICD-10-CM

## 2022-12-19 DIAGNOSIS — I10 Essential (primary) hypertension: Secondary | ICD-10-CM

## 2022-12-19 NOTE — Telephone Encounter (Signed)
Requested Prescriptions  Pending Prescriptions Disp Refills   lisinopril-hydrochlorothiazide (ZESTORETIC) 20-25 MG tablet [Pharmacy Med Name: lisinopril 20 mg-hydrochlorothiazide 25 mg tablet] 90 tablet 3    Sig: TAKE ONE TABLET BY MOUTH EVERY MORNING     Cardiovascular:  ACEI + Diuretic Combos Passed - 12/19/2022  1:24 PM      Passed - Na in normal range and within 180 days    Sodium  Date Value Ref Range Status  12/17/2022 141 134 - 144 mmol/L Final         Passed - K in normal range and within 180 days    Potassium  Date Value Ref Range Status  12/17/2022 4.5 3.5 - 5.2 mmol/L Final         Passed - Cr in normal range and within 180 days    Creatinine, Ser  Date Value Ref Range Status  12/17/2022 0.96 0.76 - 1.27 mg/dL Final         Passed - eGFR is 30 or above and within 180 days    GFR calc Af Amer  Date Value Ref Range Status  11/14/2020 85 >59 mL/min/1.73 Final    Comment:    **In accordance with recommendations from the NKF-ASN Task force,**   [facility] is in the process of updating its eGFR calculation to the   2021 CKD-EPI creatinine equation that estimates kidney function   without a race variable.    GFR, Estimated  Date Value Ref Range Status  08/09/2020 >60 >60 mL/min Final    Comment:    (NOTE) Calculated using the CKD-EPI Creatinine Equation (2021)    GFR calc non Af Amer  Date Value Ref Range Status  11/14/2020 73 >59 mL/min/1.73 Final   eGFR  Date Value Ref Range Status  12/17/2022 87 >59 mL/min/1.73 Final         Passed - Patient is not pregnant      Passed - Last BP in normal range    BP Readings from Last 1 Encounters:  12/17/22 106/73         Passed - Valid encounter within last 6 months    Recent Outpatient Visits           2 days ago Colon cancer screening   [place], Michelle P, NP   9 months ago Essential hypertension   [place], Michelle P, NP   1  year ago Need for immunization against influenza   [place], Michelle P, NP   1 year ago Prediabetes   [place], Michelle P, NP   2 years ago Essential hypertension   [place], Michelle P, NP               pantoprazole (PROTONIX) 40 MG tablet [Pharmacy Med Name: pantoprazole 40 mg tablet,delayed release] 90 tablet 1    Sig: TAKE 1 Tablet BY MOUTH ONCE DAILY     Gastroenterology: Proton Pump Inhibitors Passed - 12/19/2022  1:24 PM      Passed - Valid encounter within last 12 months    Recent Outpatient Visits           2 days ago Colon cancer screening   [place], Michelle P, NP   9 months ago Essential hypertension   [place], Michelle P, NP   1 year ago Need for immunization against  influenza   [place] [facility] Kerin Perna, NP   1 year ago Prediabetes   [place], [place], NP   2 years ago Essential hypertension   [place] Kerin Perna, NP

## 2022-12-24 ENCOUNTER — Encounter (INDEPENDENT_AMBULATORY_CARE_PROVIDER_SITE_OTHER): Payer: Self-pay

## 2022-12-26 ENCOUNTER — Telehealth (INDEPENDENT_AMBULATORY_CARE_PROVIDER_SITE_OTHER): Payer: Self-pay | Admitting: Primary Care

## 2023-01-15 ENCOUNTER — Ambulatory Visit (INDEPENDENT_AMBULATORY_CARE_PROVIDER_SITE_OTHER): Payer: Medicare HMO

## 2023-01-15 ENCOUNTER — Encounter (INDEPENDENT_AMBULATORY_CARE_PROVIDER_SITE_OTHER): Payer: Self-pay

## 2023-01-15 DIAGNOSIS — Z0000 Encounter for general adult medical examination without abnormal findings: Secondary | ICD-10-CM

## 2023-01-15 DIAGNOSIS — Z0100 Encounter for examination of eyes and vision without abnormal findings: Secondary | ICD-10-CM

## 2023-01-15 MED ORDER — ZOSTER VAC RECOMB ADJUVANTED 50 MCG/0.5ML IM SUSR: 0.5000 mL | Freq: Once | INTRAMUSCULAR | 1 refills | Status: DC

## 2023-01-15 MED ORDER — ZOSTER VAC RECOMB ADJUVANTED 50 MCG/0.5ML IM SUSR: 0.5000 mL | Freq: Once | INTRAMUSCULAR | 1 refills | Status: AC

## 2023-01-15 NOTE — Patient Instructions (Signed)
Richard Myers , Thank you for taking time to come for your Medicare Wellness Visit. I appreciate your ongoing commitment to your health goals. Please review the following plan we discussed and let me know if I can assist you in the future.   These are the goals we discussed:  Goals   None     This is a list of the screening recommended for you and due dates:  Health Maintenance  Topic Date Due   Hepatitis C Screening: USPSTF Recommendation to screen - Ages 27-79 yo.  Never done   Colon Cancer Screening  Never done   Zoster (Shingles) Vaccine (1 of 2) Never done   Pneumonia Vaccine (2 of 2 - PCV) 03/14/2022   COVID-19 Vaccine (4 - 2023-24 season) 06/08/2022   Flu Shot  05/09/2023   Medicare Annual Wellness Visit  01/15/2024   DTaP/Tdap/Td vaccine (2 - Td or Tdap) 11/14/2030   HPV Vaccine  Aged Out   Health Maintenance After Age 66 After age 17, you are at a higher risk for certain long-term diseases and infections as well as injuries from falls. Falls are a major cause of broken bones and head injuries in people who are older than age 38. Getting regular preventive care can help to keep you healthy and well. Preventive care includes getting regular testing and making lifestyle changes as recommended by your health care provider. Talk with your health care provider about: Which screenings and tests you should have. A screening is a test that checks for a disease when you have no symptoms. A diet and exercise plan that is right for you. What should I know about screenings and tests to prevent falls? Screening and testing are the best ways to find a health problem early. Early diagnosis and treatment give you the best chance of managing medical conditions that are common after age 95. Certain conditions and lifestyle choices may make you more likely to have a fall. Your health care provider may recommend: Regular vision checks. Poor vision and conditions such as cataracts can make you more  likely to have a fall. If you wear glasses, make sure to get your prescription updated if your vision changes. Medicine review. Work with your health care provider to regularly review all of the medicines you are taking, including over-the-counter medicines. Ask your health care provider about any side effects that may make you more likely to have a fall. Tell your health care provider if any medicines that you take make you feel dizzy or sleepy. Strength and balance checks. Your health care provider may recommend certain tests to check your strength and balance while standing, walking, or changing positions. Foot health exam. Foot pain and numbness, as well as not wearing proper footwear, can make you more likely to have a fall. Screenings, including: Osteoporosis screening. Osteoporosis is a condition that causes the bones to get weaker and break more easily. Blood pressure screening. Blood pressure changes and medicines to control blood pressure can make you feel dizzy. Depression screening. You may be more likely to have a fall if you have a fear of falling, feel depressed, or feel unable to do activities that you used to do. Alcohol use screening. Using too much alcohol can affect your balance and may make you more likely to have a fall. Follow these instructions at home: Lifestyle Do not drink alcohol if: Your health care provider tells you not to drink. If you drink alcohol: Limit how much you have to: 0-1  drink a day for women. 0-2 drinks a day for men. Know how much alcohol is in your drink. In the [place] one drink equals one 12 oz bottle of beer (355 mL), one 5 oz glass of wine (148 mL), or one 1 oz glass of hard liquor (44 mL). Do not use any products that contain nicotine or tobacco. These products include cigarettes, chewing tobacco, and vaping devices, such as e-cigarettes. If you need help quitting, ask your health care provider. Activity  Follow a regular exercise program to stay  fit. This will help you maintain your balance. Ask your health care provider what types of exercise are appropriate for you. If you need a cane or walker, use it as recommended by your health care provider. Wear supportive shoes that have nonskid soles. Safety  Remove any tripping hazards, such as rugs, cords, and clutter. Install safety equipment such as grab bars in bathrooms and safety rails on stairs. Keep rooms and walkways well-lit. General instructions Talk with your health care provider about your risks for falling. Tell your health care provider if: You fall. Be sure to tell your health care provider about all falls, even ones that seem minor. You feel dizzy, tiredness (fatigue), or off-balance. Take over-the-counter and prescription medicines only as told by your health care provider. These include supplements. Eat a healthy diet and maintain a healthy weight. A healthy diet includes low-fat dairy products, low-fat (lean) meats, and fiber from whole grains, beans, and lots of fruits and vegetables. Stay current with your vaccines. Schedule regular health, dental, and eye exams. Summary Having a healthy lifestyle and getting preventive care can help to protect your health and wellness after age 35. Screening and testing are the best way to find a health problem early and help you avoid having a fall. Early diagnosis and treatment give you the best chance for managing medical conditions that are more common for people who are older than age 87. Falls are a major cause of broken bones and head injuries in people who are older than age 48. Take precautions to prevent a fall at home. Work with your health care provider to learn what changes you can make to improve your health and wellness and to prevent falls. This information is not intended to replace advice given to you by your health care provider. Make sure you discuss any questions you have with your health care provider. Document  Revised: 02/13/2021 Document Reviewed: 02/13/2021 Elsevier Patient Education  2023 [employer].

## 2023-01-15 NOTE — Progress Notes (Signed)
Subjective:   Richard Myers is a 67 y.o. male who presents for Medicare Annual/Subsequent preventive examination.  Review of Systems     Cardiac Risk Factors include: none     Objective:    There were no vitals filed for this visit. There is no height or weight on file to calculate BMI.     01/15/2023   10:36 AM 08/15/2020    7:25 PM 08/08/2020    6:00 PM  Advanced Directives  Does Patient Have a Medical Advance Directive? No No No  Would patient like information on creating a medical advance directive? Yes (ED - Information included in AVS) No - Patient declined No - Patient declined    Current Medications (verified) Outpatient Encounter Medications as of 01/15/2023  Medication Sig   amLODipine (NORVASC) 10 MG tablet TAKE ONE TABLET BY MOUTH EVERY DAY   atorvastatin (LIPITOR) 40 MG tablet TAKE 1 Tablet BY MOUTH ONCE DAILY   Cholecalciferol (VITAMIN D3) 50 MCG (2000 UT) capsule TAKE ONE TABLET BY MOUTH EVERY DAY   clopidogrel (PLAVIX) 75 MG tablet Take 1 tablet (75 mg total) by mouth daily.   Iron, Ferrous Sulfate, 325 (65 Fe) MG TABS Take by mouth.   pantoprazole (PROTONIX) 40 MG tablet TAKE 1 Tablet BY MOUTH ONCE DAILY   Multiple Vitamin (MULTI-VITAMIN DAILY PO) Take 1 tablet by mouth daily. (Patient not taking: Reported on 01/05/2022)   No facility-administered encounter medications on file as of 01/15/2023.    Allergies (verified) Patient has no known allergies.   History: Past Medical History:  Diagnosis Date   GERD (gastroesophageal reflux disease)    Hyperlipemia    Hypertension    Stroke    History reviewed. No pertinent surgical history. Family History  Problem Relation Age of Onset   Arthritis Mother    Diabetes Mother    Hypertension Mother    Alcohol abuse Father    Social History   Socioeconomic History   Marital status: Single    Spouse name: Not on file   Number of children: Not on file   Years of education: Not on file   Highest education  level: Not on file  Occupational History   Not on file  Tobacco Use   Smoking status: Never   Smokeless tobacco: Never  Substance and Sexual Activity   Alcohol use: Not Currently    Comment: varies in amount   Drug use: No   Sexual activity: Yes  Other Topics Concern   Not on file  Social History Narrative   Not on file   Social Determinants of Health   Financial Resource Strain: Low Risk  (01/15/2023)   Overall Financial Resource Strain (CARDIA)    Difficulty of Paying Living Expenses: Not hard at all  Food Insecurity: No Food Insecurity (01/15/2023)   Hunger Vital Sign    Worried About Running Out of Food in the Last Year: Never true    Ran Out of Food in the Last Year: Never true  Transportation Needs: No Transportation Needs (01/15/2023)   PRAPARE - Administrator, Civil Service (Medical): No    Lack of Transportation (Non-Medical): No  Physical Activity: Insufficiently Active (01/15/2023)   Exercise Vital Sign    Days of Exercise per Week: 3 days    Minutes of Exercise per Session: 30 min  Stress: No Stress Concern Present (01/15/2023)   [employer] of Occupational Health - Occupational Stress Questionnaire    Feeling of Stress : Not at  all  Social Connections: Moderately Isolated (01/15/2023)   Social Connection and Isolation Panel [NHANES]    Frequency of Communication with Friends and Family: More than three times a week    Frequency of Social Gatherings with Friends and Family: Once a week    Attends Religious Services: More than 4 times per year    Active Member of [employer] or Organizations: No    Attends Engineer, structural: Never    Marital Status: Never married    Tobacco Counseling Counseling given: Not Answered   Clinical Intake:  Pre-visit preparation completed: No  Pain : No/denies pain     Nutritional Risks: None Diabetes: No  How often do you need to have someone help you when you read instructions, pamphlets, or other  written materials from your doctor or pharmacy?: 1 - Never  Diabetic?no  Interpreter Needed?: No      Activities of Daily Living    01/15/2023   10:37 AM  In your present state of health, do you have any difficulty performing the following activities:  Hearing? 0  Vision? 0  Difficulty concentrating or making decisions? 0  Walking or climbing stairs? 0  Dressing or bathing? 0  Doing errands, shopping? 0  Preparing Food and eating ? N  Using the Toilet? N  In the past six months, have you accidently leaked urine? N  Do you have problems with loss of bowel control? N  Managing your Medications? N  Managing your Finances? N  Housekeeping or managing your Housekeeping? N    Patient Care Team: Grayce Sessions, NP as PCP - General (Internal Medicine)  Indicate any recent Medical Services you may have received from other than [facility] providers in the past year (date may be approximate).     Assessment:   This is a routine wellness examination for Richard Myers.  Hearing/Vision screen No results found.  Dietary issues and exercise activities discussed: Current Exercise Habits: The patient does not participate in regular exercise at present, Exercise limited by: None identified   Goals Addressed   None    Depression Screen    01/15/2023   10:37 AM 01/15/2023   10:36 AM 12/17/2022   10:15 AM 03/16/2022   10:55 AM 01/05/2022    2:05 PM 06/14/2021   10:59 AM 03/14/2021    2:42 PM  PHQ 2/9 Scores  PHQ - 2 Score 0 0 0 3 0 0 0  PHQ- 9 Score    5       Fall Risk    01/15/2023   10:36 AM 12/17/2022   10:15 AM 03/16/2022   10:55 AM 01/05/2022    2:08 PM 06/14/2021   10:59 AM  Fall Risk   Falls in the past year? 0 0 0 0 0  Number falls in past yr: 0 0  0   Injury with Fall? 0 0  0   Risk for fall due to : No Fall Risks No Fall Risks  No Fall Risks   Follow up Falls prevention discussed   Falls evaluation completed     FALL RISK PREVENTION PERTAINING TO THE HOME:  Any stairs in or  around the home? No  If so, are there any without handrails? No  Home free of loose throw rugs in walkways, pet beds, electrical cords, etc? Yes  Adequate lighting in your home to reduce risk of falls? Yes   ASSISTIVE DEVICES UTILIZED TO PREVENT FALLS:  Life alert? No  Use of a cane,  walker or w/c? No  Grab bars in the bathroom? Yes  Shower chair or bench in shower? No  Elevated toilet seat or a handicapped toilet? No   TIMED UP AND GO:  Was the test performed? No .  Length of time to ambulate 10 feet: N/A sec.   Gait slow and steady without use of assistive device  Cognitive Function:    01/15/2023   10:37 AM  MMSE - Mini Mental State Exam  Orientation to time 5  Orientation to Place 5  Registration 3  Attention/ Calculation 5  Recall 3  Language- name 2 objects 2  Language- repeat 1  Language- follow 3 step command 3  Language- read & follow direction 1  Write a sentence 1  Copy design 1  Total score 30        01/15/2023   10:37 AM 01/05/2022    2:09 PM  6CIT Screen  What Year? 0 points 4 points  What month? 0 points 3 points  What time? 0 points 3 points  Count back from 20 0 points 0 points  Months in reverse 0 points 0 points  Repeat phrase 0 points 0 points  Total Score 0 points 10 points    Immunizations Immunization History  Administered Date(s) Administered   Influenza,inj,Quad PF,6+ Mos 08/09/2020, 06/14/2021   PFIZER(Purple Top)SARS-COV-2 Vaccination 06/16/2020, 07/26/2020, 01/14/2021   Pneumococcal Polysaccharide-23 03/14/2021   Tdap 11/14/2020    TDAP status: Up to date  Flu Vaccine status: Up to date  Pneumococcal vaccine status: Due, Education has been provided regarding the importance of this vaccine. Advised may receive this vaccine at local pharmacy or Health Dept. Aware to provide a copy of the vaccination record if obtained from local pharmacy or Health Dept. Verbalized acceptance and understanding.  Covid-19 vaccine status: Completed  vaccines  Qualifies for Shingles Vaccine? Yes   Zostavax completed  prescription has been given to patient   Shingrix Completed?: No.    Education has been provided regarding the importance of this vaccine. Patient has been advised to call insurance company to determine out of pocket expense if they have not yet received this vaccine. Advised may also receive vaccine at local pharmacy or Health Dept. Verbalized acceptance and understanding.  Screening Tests Health Maintenance  Topic Date Due   Hepatitis C Screening  Never done   COLONOSCOPY (Pts 45-4yrs Insurance coverage will need to be confirmed)  Never done   Zoster Vaccines- Shingrix (1 of 2) Never done   Pneumonia Vaccine 57+ Years old (2 of 2 - PCV) 03/14/2022   COVID-19 Vaccine (4 - 2023-24 season) 06/08/2022   Medicare Annual Wellness (AWV)  01/06/2023   INFLUENZA VACCINE  05/09/2023   DTaP/Tdap/Td (2 - Td or Tdap) 11/14/2030   HPV VACCINES  Aged Out    Health Maintenance  Health Maintenance Due  Topic Date Due   Hepatitis C Screening  Never done   COLONOSCOPY (Pts 45-64yrs Insurance coverage will need to be confirmed)  Never done   Zoster Vaccines- Shingrix (1 of 2) Never done   Pneumonia Vaccine 21+ Years old (2 of 2 - PCV) 03/14/2022   COVID-19 Vaccine (4 - 2023-24 season) 06/08/2022   Medicare Annual Wellness (AWV)  01/06/2023    Colorectal cancer screening: Referral to GI placed 12/17/2022. Pt aware the office will call re: appt.  Lung Cancer Screening: (Low Dose CT Chest recommended if Age 37-80 years, 30 pack-year currently smoking OR have quit w/in 15years.) does not qualify.  Lung Cancer Screening Referral: N/A  Additional Screening:  Hepatitis C Screening: does qualify; Completed not yet  Vision Screening: Recommended annual ophthalmology exams for early detection of glaucoma and other disorders of the eye. Is the patient up to date with their annual eye exam?  No  Who is the provider or what is the name  of the office in which the patient attends annual eye exams? N/A If pt is not established with a provider, would they like to be referred to a provider to establish care? Yes .   Dental Screening: Recommended annual dental exams for proper oral hygiene  Community Resource Referral / Chronic Care Management: CRR required this visit?  No   CCM required this visit?  No      Plan:     I have personally reviewed and noted the following in the patient's chart:   Medical and social history Use of alcohol, tobacco or illicit drugs  Current medications and supplements including opioid prescriptions. Patient is not currently taking opioid prescriptions. Functional ability and status Nutritional status Physical activity Advanced directives List of other physicians Hospitalizations, surgeries, and ER visits in previous 12 months Vitals Screenings to include cognitive, depression, and falls Referrals and appointments  In addition, I have reviewed and discussed with patient certain preventive protocols, quality metrics, and best practice recommendations. A written personalized care plan for preventive services as well as general preventive health recommendations were provided to patient.     Ronette Deter, CMA   01/15/2023   Nurse Notes:  Richard Myers , Thank you for taking time to come for your Medicare Wellness Visit. I appreciate your ongoing commitment to your health goals. Please review the following plan we discussed and let me know if I can assist you in the future.   These are the goals we discussed:  Goals   None     This is a list of the screening recommended for you and due dates:  Health Maintenance  Topic Date Due   Hepatitis C Screening: USPSTF Recommendation to screen - Ages 22-79 yo.  Never done   Colon Cancer Screening  Never done   Zoster (Shingles) Vaccine (1 of 2) Never done   Pneumonia Vaccine (2 of 2 - PCV) 03/14/2022   COVID-19 Vaccine (4 - 2023-24  season) 06/08/2022   Flu Shot  05/09/2023   Medicare Annual Wellness Visit  01/15/2024   DTaP/Tdap/Td vaccine (2 - Td or Tdap) 11/14/2030   HPV Vaccine  Aged Out

## 2023-02-13 ENCOUNTER — Ambulatory Visit (INDEPENDENT_AMBULATORY_CARE_PROVIDER_SITE_OTHER): Payer: Medicare HMO | Admitting: Gastroenterology

## 2023-02-13 ENCOUNTER — Encounter: Payer: Self-pay | Admitting: Gastroenterology

## 2023-02-13 VITALS — BP 108/70 | HR 92 | Ht 63.0 in | Wt 182.2 lb

## 2023-02-13 DIAGNOSIS — Z1211 Encounter for screening for malignant neoplasm of colon: Secondary | ICD-10-CM | POA: Diagnosis not present

## 2023-02-13 DIAGNOSIS — Z9229 Personal history of other drug therapy: Secondary | ICD-10-CM | POA: Diagnosis not present

## 2023-02-13 DIAGNOSIS — Z8673 Personal history of transient ischemic attack (TIA), and cerebral infarction without residual deficits: Secondary | ICD-10-CM | POA: Diagnosis not present

## 2023-02-13 DIAGNOSIS — Z1212 Encounter for screening for malignant neoplasm of rectum: Secondary | ICD-10-CM

## 2023-02-13 MED ORDER — NA SULFATE-K SULFATE-MG SULF 17.5-3.13-1.6 GM/177ML PO SOLN: 1.0000 | Freq: Once | ORAL | 0 refills | Status: AC

## 2023-02-13 NOTE — Patient Instructions (Addendum)
_______________________________________________________  If your blood pressure at your visit was 140/90 or greater, please contact your primary care physician to follow up on this.  _______________________________________________________  If you are age 67 or older, your body mass index should be between 23-30. Your Body mass index is 32.28 kg/m. If this is out of the aforementioned range listed, please consider follow up with your Primary Care Provider.  If you are age 64 or younger, your body mass index should be between 19-25. Your Body mass index is 32.28 kg/m. If this is out of the aformentioned range listed, please consider follow up with your Primary Care Provider.   You have been scheduled for a colonoscopy. Please follow written instructions given to you at your visit today.  Please pick up your prep supplies at the pharmacy within the next 1-3 days. If you use inhalers (even only as needed), please bring them with you on the day of your procedure.   ________________________________________________________  The  GI providers would like to encourage you to use MYCHART to communicate with providers for non-urgent requests or questions.  Due to long hold times on the telephone, sending your provider a message by MYCHART may be a faster and more efficient way to get a response.  Please allow 48 business hours for a response.  Please remember that this is for non-urgent requests.   It was a pleasure to see you today!  Thank you for trusting me with your gastrointestinal care!     

## 2023-02-13 NOTE — Progress Notes (Unsigned)
HPI : Richard Myers is a very pleasant 67 year old male with a history of hypertension and stroke (2021) on Plavix who is referred to [place] by Gwinda Passe, NP for consideration of screening colonoscopy.  No lower GI symptoms.  Had problems with heartburn which resolved with pantoprazole.  He has been taking it about about.  Started having symptoms about 1-2 years prior. No dysphagia.  Has had 2 strokes, most recently in 2021  Still having issues with recurrent gluteal abscess; last time was about 2 days ago. Was scheduled for surgery, but cancelled due to financial reasons.     Past Medical History:  Diagnosis Date   GERD (gastroesophageal reflux disease)    Hyperlipemia    Hypertension    Stroke (HCC)      No past surgical history on file. Family History  Problem Relation Age of Onset   Arthritis Mother    Diabetes Mother    Hypertension Mother    Alcohol abuse Father    Social History   Tobacco Use   Smoking status: Never   Smokeless tobacco: Never  Substance Use Topics   Alcohol use: Not Currently    Comment: varies in amount   Drug use: No   Current Outpatient Medications  Medication Sig Dispense Refill   amLODipine (NORVASC) 10 MG tablet TAKE ONE TABLET BY MOUTH EVERY DAY 90 tablet 3   atorvastatin (LIPITOR) 40 MG tablet TAKE 1 Tablet BY MOUTH ONCE DAILY 90 tablet 2   Cholecalciferol (VITAMIN D3) 50 MCG (2000 UT) capsule TAKE ONE TABLET BY MOUTH EVERY DAY 200 capsule 0   clopidogrel (PLAVIX) 75 MG tablet Take 1 tablet (75 mg total) by mouth daily. 90 tablet 1   Iron, Ferrous Sulfate, 325 (65 Fe) MG TABS Take by mouth.     Multiple Vitamin (MULTI-VITAMIN DAILY PO) Take 1 tablet by mouth daily. (Patient not taking: Reported on 01/05/2022)     pantoprazole (PROTONIX) 40 MG tablet TAKE 1 Tablet BY MOUTH ONCE DAILY 90 tablet 1   No current facility-administered medications for this visit.   No Known Allergies   Review of Systems: All systems reviewed  and negative except where noted in HPI.    No results found.  Physical Exam: BP 108/70 (BP Location: Left Arm, Patient Position: Sitting, Cuff Size: Normal)   Pulse 92   Ht 5\' 3"  (1.6 m)   Wt 182 lb 4 oz (82.7 kg)   BMI 32.28 kg/m  Constitutional: Pleasant,well-developed, ***male in no acute distress. HEENT: Normocephalic and atraumatic. Conjunctivae are normal. No scleral icterus. Neck supple.  Cardiovascular: Normal rate, regular rhythm.  Pulmonary/chest: Effort normal and breath sounds normal. No wheezing, rales or rhonchi. Abdominal: Soft, nondistended, nontender. Bowel sounds active throughout. There are no masses palpable. No hepatomegaly. Extremities: no edema Lymphadenopathy: No cervical adenopathy noted. Neurological: Alert and oriented to person place and time. Skin: Skin is warm and dry. No rashes noted. Psychiatric: Normal mood and affect. Behavior is normal.  CBC    Component Value Date/Time   WBC 3.7 12/17/2022 1031   WBC 4.5 08/09/2020 0435   RBC 6.02 (H) 12/17/2022 1031   RBC 5.36 08/09/2020 0435   HGB 14.2 12/17/2022 1031   HCT 44.2 12/17/2022 1031   PLT 211 12/17/2022 1031   MCV 73 (L) 12/17/2022 1031   MCH 23.6 (L) 12/17/2022 1031   MCH 23.9 (L) 08/09/2020 0435   MCHC 32.1 12/17/2022 1031   MCHC 32.2 08/09/2020 0435   RDW 17.9 (H) 12/17/2022  1031   LYMPHSABS 1.9 12/17/2022 1031   MONOABS 0.4 08/09/2020 0435   EOSABS 0.2 12/17/2022 1031   BASOSABS 0.0 12/17/2022 1031    CMP     Component Value Date/Time   NA 141 12/17/2022 1031   K 4.5 12/17/2022 1031   CL 101 12/17/2022 1031   CO2 23 12/17/2022 1031   GLUCOSE 95 12/17/2022 1031   GLUCOSE 94 05/11/2021 0911   BUN 13 12/17/2022 1031   CREATININE 0.96 12/17/2022 1031   CALCIUM 9.4 12/17/2022 1031   PROT 7.7 12/17/2022 1031   ALBUMIN 4.6 12/17/2022 1031   AST 18 12/17/2022 1031   ALT 16 12/17/2022 1031   ALKPHOS 103 12/17/2022 1031   BILITOT 0.5 12/17/2022 1031   GFRNONAA 73 11/14/2020  1140   GFRNONAA >60 08/09/2020 0435   GFRAA 85 11/14/2020 1140     ASSESSMENT AND PLAN:  CRC screening - Colonoscopy - Hold Plavix 5 days (clear with neurology)  Grayce Sessions, NP

## 2023-02-25 ENCOUNTER — Other Ambulatory Visit (INDEPENDENT_AMBULATORY_CARE_PROVIDER_SITE_OTHER): Payer: Self-pay | Admitting: Primary Care

## 2023-02-25 DIAGNOSIS — I10 Essential (primary) hypertension: Secondary | ICD-10-CM

## 2023-02-25 DIAGNOSIS — Z760 Encounter for issue of repeat prescription: Secondary | ICD-10-CM

## 2023-02-26 NOTE — Telephone Encounter (Signed)
Will forward to provider  

## 2023-02-26 NOTE — Telephone Encounter (Signed)
Requested medications are due for refill today.  unsure  Requested medications are on the active medications list.  yes  Last refill. 02/13/2023  Future visit scheduled.   no  Notes to clinic.  Medication is historical.    Requested Prescriptions  Pending Prescriptions Disp Refills   lisinopril-hydrochlorothiazide (ZESTORETIC) 20-25 MG tablet [Pharmacy Med Name: lisinopril 20 mg-hydrochlorothiazide 25 mg tablet] 90 tablet 3    Sig: TAKE ONE TABLET BY MOUTH EVERY MORNING     Cardiovascular:  ACEI + Diuretic Combos Passed - 02/25/2023 12:49 PM      Passed - Na in normal range and within 180 days    Sodium  Date Value Ref Range Status  12/17/2022 141 134 - 144 mmol/L Final         Passed - K in normal range and within 180 days    Potassium  Date Value Ref Range Status  12/17/2022 4.5 3.5 - 5.2 mmol/L Final         Passed - Cr in normal range and within 180 days    Creatinine, Ser  Date Value Ref Range Status  12/17/2022 0.96 0.76 - 1.27 mg/dL Final         Passed - eGFR is 30 or above and within 180 days    GFR calc Af Amer  Date Value Ref Range Status  11/14/2020 85 >59 mL/min/1.73 Final    Comment:    **In accordance with recommendations from the NKF-ASN Task force,**   [facility] is in the process of updating its eGFR calculation to the   2021 CKD-EPI creatinine equation that estimates kidney function   without a race variable.    GFR, Estimated  Date Value Ref Range Status  08/09/2020 >60 >60 mL/min Final    Comment:    (NOTE) Calculated using the CKD-EPI Creatinine Equation (2021)    GFR calc non Af Amer  Date Value Ref Range Status  11/14/2020 73 >59 mL/min/1.73 Final   eGFR  Date Value Ref Range Status  12/17/2022 87 >59 mL/min/1.73 Final         Passed - Patient is not pregnant      Passed - Last BP in normal range    BP Readings from Last 1 Encounters:  02/13/23 108/70         Passed - Valid encounter within last 6 months    Recent Outpatient  Visits           2 months ago Colon cancer screening   [place] [facility] Grayce Sessions, NP   11 months ago Essential hypertension   [place] [facility] Grayce Sessions, NP   1 year ago Need for immunization against influenza   [place] [facility] Grayce Sessions, NP   1 year ago Prediabetes   [place] [facility] Grayce Sessions, NP   2 years ago Essential hypertension   [place] [facility] Grayce Sessions, NP

## 2023-03-07 ENCOUNTER — Encounter: Payer: Self-pay | Admitting: Gastroenterology

## 2023-03-11 ENCOUNTER — Telehealth: Payer: Self-pay

## 2023-03-11 NOTE — Telephone Encounter (Signed)
Received Clearance form from [employer]. Will placed forms in Dr. Pearlean Brownie in basket to sign for [name], NP.

## 2023-03-13 ENCOUNTER — Other Ambulatory Visit (INDEPENDENT_AMBULATORY_CARE_PROVIDER_SITE_OTHER): Payer: Self-pay | Admitting: Primary Care

## 2023-03-13 NOTE — Telephone Encounter (Signed)
Requested Prescriptions  Pending Prescriptions Disp Refills   atorvastatin (LIPITOR) 40 MG tablet [Pharmacy Med Name: atorvastatin 40 mg tablet] 90 tablet 0    Sig: TAKE 1 Tablet BY MOUTH ONCE DAILY     Cardiovascular:  Antilipid - Statins Failed - 03/13/2023  9:53 AM      Failed - Lipid Panel in normal range within the last 12 months    Cholesterol, Total  Date Value Ref Range Status  12/17/2022 147 100 - 199 mg/dL Final   LDL Chol Calc (NIH)  Date Value Ref Range Status  12/17/2022 81 0 - 99 mg/dL Final   HDL  Date Value Ref Range Status  12/17/2022 50 >39 mg/dL Final   Triglycerides  Date Value Ref Range Status  12/17/2022 81 0 - 149 mg/dL Final         Passed - Patient is not pregnant      Passed - Valid encounter within last 12 months    Recent Outpatient Visits           2 months ago Colon cancer screening   [place] [facility] Grayce Sessions, NP   12 months ago Essential hypertension   [place] [facility] Grayce Sessions, NP   1 year ago Need for immunization against influenza   [place] [facility] Grayce Sessions, NP   1 year ago Prediabetes   [place] [facility] Grayce Sessions, NP   2 years ago Essential hypertension    [facility] Grayce Sessions, NP

## 2023-03-14 ENCOUNTER — Telehealth: Payer: Self-pay

## 2023-03-14 NOTE — Telephone Encounter (Signed)
Contacted [facility] regarding patients clearance to hold Plavix 5 days prior to his procedure. I was informed that letter had been placed on Dr.Sethi's desk but then told the patient has not been seen in 2 years and would need an appointment before being cleared to hold Plavix. I contacted patient's daughter and let her know that patient needs to call neurology and schedule an appointment and his procedure would have to be rescheduled. Patient's daughter stated that she would call back to reschedule patient's procedure.

## 2023-03-18 NOTE — Telephone Encounter (Signed)
Pt hasn't been seen since 2022. Faxed it back to [employer].

## 2023-03-18 NOTE — Telephone Encounter (Signed)
Attempted to contact patient to let him know that his procedure has been cancelled due to not being cleared by neurology to hold Plavix. Patient has not seen by them in 2 years. Patient's daughter has not called back to reschedule.

## 2023-03-19 NOTE — Telephone Encounter (Signed)
Patient returned call and is aware that procedure will have to be rescheduled. Patient was given [facility] phone number to call and schedule an appointment.

## 2023-03-19 NOTE — Telephone Encounter (Signed)
PT scheduled appt with neurologist for 7/23 @ 215pm. Rescheduled colonoscopy for 8/7 @1030 

## 2023-03-21 ENCOUNTER — Encounter: Payer: Medicare HMO | Admitting: Gastroenterology

## 2023-03-21 NOTE — Telephone Encounter (Signed)
Received fax from [facility] stating  patient could hold Plavix 5 days prior to his procedure. Letter sent to scan.

## 2023-03-22 ENCOUNTER — Other Ambulatory Visit (INDEPENDENT_AMBULATORY_CARE_PROVIDER_SITE_OTHER): Payer: Self-pay | Admitting: Primary Care

## 2023-03-22 NOTE — Telephone Encounter (Signed)
Requested Prescriptions  Refused Prescriptions Disp Refills   clopidogrel (PLAVIX) 75 MG tablet [Pharmacy Med Name: clopidogrel 75 mg tablet] 90 tablet 1    Sig: Take 1 Tablet by mouth once daily     Hematology: Antiplatelets - clopidogrel Passed - 03/22/2023 12:03 PM      Passed - HCT in normal range and within 180 days    Hematocrit  Date Value Ref Range Status  12/17/2022 44.2 37.5 - 51.0 % Final         Passed - HGB in normal range and within 180 days    Hemoglobin  Date Value Ref Range Status  12/17/2022 14.2 13.0 - 17.7 g/dL Final         Passed - PLT in normal range and within 180 days    Platelets  Date Value Ref Range Status  12/17/2022 211 150 - 450 x10E3/uL Final         Passed - Cr in normal range and within 360 days    Creatinine, Ser  Date Value Ref Range Status  12/17/2022 0.96 0.76 - 1.27 mg/dL Final         Passed - Valid encounter within last 6 months    Recent Outpatient Visits           3 months ago Colon cancer screening   [place] [facility] Grayce Sessions, NP   1 year ago Essential hypertension   [place] [facility] Grayce Sessions, NP   1 year ago Need for immunization against influenza   [place] [facility] Grayce Sessions, NP   2 years ago Prediabetes   [place] [facility] Grayce Sessions, NP   2 years ago Essential hypertension   [place] [facility] Grayce Sessions, NP

## 2023-04-29 NOTE — Progress Notes (Unsigned)
[facility] [address] [place]. [place] [address] [phone]       STROKE FOLLOW UP NOTE  [name] Date of Birth:  05-30-1956 Medical Record Number:  [id number]   Reason for Referral: stroke follow up    SUBJECTIVE:   CHIEF COMPLAINT:  No chief complaint on file.   HPI:   Update 04/30/2023 JM: Patient returns to obtain medical clearance for colonoscopy currently scheduled 8/7.  History of stroke 07/2020 and has been stable since that time without any new or reoccurring stroke/TIA symptoms.  Denies any residual stroke deficits.  Continues on Plavix and atorvastatin.  Routinely follows with PCP for stroke risk factor management.        History provided for reference purposes only Update 12/27/2020 JM: Richard Myers returns for stroke follow-up after prior visit approximately 4 months ago.  Doing well since prior visit - reports complete recovery since prior visit Denies new stroke/TIA symptoms  Compliant on Plavix and atorvastatin 40mg  -denies associated side effects Blood pressure today 130/69. amlodipine added 2/7 in addition to lisinopril-hydrochlorothiazide - monitors at home which has been stable  Recent lipid panel 11/2020 LDL 83 down from 121  Referred to [facility] for possible underlying sleep apnea - no show to initial evaluation with Dr. Frances Furbish -he has not rescheduled - he reports improvement of insomnia but occasional day time fatigue but has been improving since he started to take vitamins (ACEs+Zinc)  No concerns at this time  Initial visit 09/06/2020 JM: Richard Myers is being seen for hospital follow-up unaccompanied.  Reports residual imbalance and visual spatial deficit which has been slowly improving.  Denies any difficulty with double or blurred vision.  He has not done any type of therapy since discharge.  Reports a sensation generalized "weakness" and decreased activity tolerance.  Reports insomnia, excessive  daytime fatigue and loud snoring.  He has not previously underwent sleep study. Denies new or worsening stroke/TIA symptoms.  Completed 3 weeks DAPT and remains on plavix alone without bleeding or bruising. Remains on atorvastatin without myalgias.  Blood pressure today satisfactory at 132/76.  No further concerns at this time.  Stroke admission 08/07/2020 Richard Myers is a 67 y.o. male with history of  HTN, HLD and stroke  who presented on 08/07/2020 with double vision x 2 weeks.  Personally reviewed pertinent hospitalization progress notes, lab work and imaging with summary provided.  Evaluated by Dr. Roda Shutters with stroke work-up revealing right midbrain infarct secondary to small vessel disease source.  MRI showed evidence of old b/l basal ganglia and pontine lacunes, and old R frontal lobe white matter and thalamic hemorrhages.  On aspirin PTA -initiated DAPT for 3 weeks and Plavix alone. Hx of HTN stable on high-end with long-term BP goal normotensive range.  LDL 121 and initiate atorvastatin 40 mg daily.  No history of DM with A1c 6.2 indicating prediabetes.  Other stroke risk factors include EtOH use and prior strokes on imaging.  Evaluated by therapies who recommended [facility] PT/OT for residual gait impairment with imbalance and visual deficit and discharged home in stable condition.  Stroke:  R midbrain infarct secondary to small vessel disease source CT head No acute abnormality. Sinus dz MRI  R midbrain infarct. Old B basal ganglia and pontine lacunes. Old R frontal lobe white matter and thalamic hemorrhages.  MRA  No LVO. Mild distal L cavernous ICA narrowing w/ prestenotic dilitation Carotid Doppler  B ICA 1-39% stenosis, VAs antegrade  2D Echo EF  60-65% LDL 121 HgbA1c 6.2 VTE prophylaxis - Heparin 5000 units sq tid  aspirin 81 mg daily prior to admission, now on aspirin 81 mg daily and clopidogrel 75 mg daily. Continue DAPT x 3 weeks then plavix alone.   Therapy recommendations:  HH PT, HH  OT Disposition:  Return home      ROS:   14 system review of systems performed and negative with exception of those listed in HPI  PMH:  Past Medical History:  Diagnosis Date   GERD (gastroesophageal reflux disease)    Hyperlipemia    Hypertension    Stroke (HCC)     PSH:  Past Surgical History:  Procedure Laterality Date   NO PAST SURGERIES      Social History:  Social History   Socioeconomic History   Marital status: Single    Spouse name: Not on file   Number of children: 5   Years of education: Not on file   Highest education level: Not on file  Occupational History   Occupation: retired  Tobacco Use   Smoking status: Never   Smokeless tobacco: Never  Vaping Use   Vaping status: Never Used  Substance and Sexual Activity   Alcohol use: Not Currently    Comment: varies in amount   Drug use: No   Sexual activity: Yes  Other Topics Concern   Not on file  Social History Narrative   Not on file   Social Determinants of Health   Financial Resource Strain: Low Risk  (01/15/2023)   Overall Financial Resource Strain (CARDIA)    Difficulty of Paying Living Expenses: Not hard at all  Food Insecurity: No Food Insecurity (01/15/2023)   Hunger Vital Sign    Worried About Running Out of Food in the Last Year: Never true    Ran Out of Food in the Last Year: Never true  Transportation Needs: No Transportation Needs (01/15/2023)   PRAPARE - Administrator, Civil Service (Medical): No    Lack of Transportation (Non-Medical): No  Physical Activity: Insufficiently Active (01/15/2023)   Exercise Vital Sign    Days of Exercise per Week: 3 days    Minutes of Exercise per Session: 30 min  Stress: No Stress Concern Present (01/15/2023)   [employer] of Occupational Health - Occupational Stress Questionnaire    Feeling of Stress : Not at all  Social Connections: Moderately Isolated (01/15/2023)   Social Connection and Isolation Panel [NHANES]    Frequency of  Communication with Friends and Family: More than three times a week    Frequency of Social Gatherings with Friends and Family: Once a week    Attends Religious Services: More than 4 times per year    Active Member of [employer] or Organizations: No    Attends Banker Meetings: Never    Marital Status: Never married  Intimate Partner Violence: Not At Risk (01/15/2023)   Humiliation, Afraid, Rape, and Kick questionnaire    Fear of Current or Ex-Partner: No    Emotionally Abused: No    Physically Abused: No    Sexually Abused: No    Family History:  Family History  Problem Relation Age of Onset   Arthritis Mother    Diabetes Mother    Hypertension Mother    Alcohol abuse Father     Medications:   Current Outpatient Medications on File Prior to Visit  Medication Sig Dispense Refill   amLODipine (NORVASC) 10 MG tablet TAKE ONE TABLET BY MOUTH  EVERY DAY 90 tablet 3   atorvastatin (LIPITOR) 40 MG tablet TAKE 1 Tablet BY MOUTH ONCE DAILY 90 tablet 0   Cholecalciferol (VITAMIN D3) 50 MCG (2000 UT) capsule TAKE ONE TABLET BY MOUTH EVERY DAY 200 capsule 0   clopidogrel (PLAVIX) 75 MG tablet Take 1 tablet (75 mg total) by mouth daily. 90 tablet 1   Iron, Ferrous Sulfate, 325 (65 Fe) MG TABS Take by mouth.     lisinopril-hydrochlorothiazide (ZESTORETIC) 20-25 MG tablet TAKE ONE TABLET BY MOUTH EVERY MORNING 90 tablet 1   Multiple Vitamin (MULTI-VITAMIN DAILY PO) Take 1 tablet by mouth daily.     pantoprazole (PROTONIX) 40 MG tablet TAKE 1 Tablet BY MOUTH ONCE DAILY 90 tablet 1   No current facility-administered medications on file prior to visit.    Allergies:  No Known Allergies    OBJECTIVE:  Physical Exam  There were no vitals filed for this visit.  There is no height or weight on file to calculate BMI. No results found.  General: well developed, well nourished,  pleasant middle-aged African-American male, seated, in no evident distress Head: head normocephalic and  atraumatic.   Neck: supple with no carotid or supraclavicular bruits Cardiovascular: regular rate and rhythm, no murmurs Musculoskeletal: no deformity Skin:  no rash/petichiae Vascular:  Normal pulses all extremities   Neurologic Exam Mental Status: Awake and fully alert.   Fluent speech and language.  Oriented to place and time. Recent and remote memory intact. Attention span, concentration and fund of knowledge appropriate. Mood and affect appropriate.  Cranial Nerves: Pupils equal, briskly reactive to light. Extraocular movements full except left eye uppercase incomplete.  No evidence of nystagmus.  Visual fields full to confrontation. Hearing intact. Facial sensation intact. Face, tongue, palate moves normally and symmetrically.  Motor: Normal bulk and tone. Normal strength in all tested extremity muscles. Sensory.: intact to touch , pinprick , position and vibratory sensation.  Coordination: Rapid alternating movements normal in all extremities. Finger-to-nose and heel-to-shin performed accurately bilaterally. Gait and Station: Arises from chair without difficulty. Stance is normal. Gait demonstrates normal stride length and balance without use of assistive device.  Mild difficulty performing tandem walk and heel toe.  Romberg negative. Reflexes: 1+ and symmetric. Toes downgoing.        ASSESSMENT: Richard Myers is a 67 y.o. year old male presented with 2 wk hx of double vision on 08/07/2020 with stroke work-up revealing right midbrain infarct secondary to small vessel disease source. Vascular risk factors include HTN, HLD, prior strokes on imaging (b/l BG and pontine lacunes and R frontal lobe white matter and thalamic hemorrhages) and EtOH use.      PLAN:  R midbrain stroke :  Recovered well without residual deficit Continue clopidogrel 75 mg daily  and atorvastatin 40 mg daily for secondary stroke prevention.  Discussed secondary stroke prevention measures and importance of  close PCP follow up for aggressive stroke risk factor management including BP goal<130/90, HLD with LDL goal<70 and pre-DM with A1c.<7   Suspected sleep apnea: Referral placed to [facility] sleep clinic for further evaluation - no show for initial evaluation with Dr. Frances Furbish -encouraged rescheduling.  Discussed importance of evaluation for possible underlying sleep apnea as untreated sleep apnea increases risk of cardiovascular disease.  He wishes to hold off at this time but is aware to call office in the future if you like to proceed with evaluation    Follow up in 6 months or call earlier if needed  CC:  [facility] provider: Dr. Mosetta Anis, Kinnie Scales, NP     I spent 30 minutes of face-to-face and non-face-to-face time with patient.  This included previsit chart review, lab review, study review, order entry, electronic health record documentation, patient education regarding prior stroke including etiology, importance of managing stroke risk factors and answered all other questions to patient satisfaction  Ihor Austin, [facility]  [facility] [address] [place], [place] [address]  Phone [phone] Fax [phone] Note: This document was prepared with digital dictation and possible smart phrase technology. Any transcriptional errors that result from this process are unintentional.

## 2023-04-30 ENCOUNTER — Encounter: Payer: Self-pay | Admitting: Adult Health

## 2023-04-30 ENCOUNTER — Ambulatory Visit (INDEPENDENT_AMBULATORY_CARE_PROVIDER_SITE_OTHER): Payer: Medicare HMO | Admitting: Adult Health

## 2023-04-30 VITALS — BP 112/74 | HR 83 | Ht 63.0 in | Wt 183.0 lb

## 2023-04-30 DIAGNOSIS — Z8673 Personal history of transient ischemic attack (TIA), and cerebral infarction without residual deficits: Secondary | ICD-10-CM | POA: Diagnosis not present

## 2023-04-30 NOTE — Patient Instructions (Addendum)
Okay to hold plavix for colonoscopy with small but acceptable risk of recurrent stroke while off therapy and recommend restarting immediately after or once stable  Continue plavix and atorvastatin for secondary stroke prevention measures  Continue to follow with your primary doctor for aggressive stroke risk factor management    Follow up as needed at this time

## 2023-05-01 ENCOUNTER — Telehealth: Payer: Self-pay

## 2023-05-01 NOTE — Telephone Encounter (Signed)
Left VM for patient to return call regarding the question he had on how long to hold Plavix. Patient received clearance to hold Plavix 5 days.

## 2023-05-02 ENCOUNTER — Telehealth: Payer: Self-pay

## 2023-05-02 NOTE — Telephone Encounter (Signed)
Error

## 2023-05-02 NOTE — Telephone Encounter (Signed)
Left VM mail letting patient know to hold plavix 5 days prior to his procedure and to return call confirming he received message.

## 2023-05-03 ENCOUNTER — Telehealth: Payer: Self-pay

## 2023-05-03 NOTE — Telephone Encounter (Signed)
Left VM for patient to return call to let me know he received my previous messages.

## 2023-05-06 NOTE — Telephone Encounter (Signed)
Contacted patient's daughter and informed her patient is to hold Plavix 5 days prior to his procedure on 05/15/23. Patient's daughter stated she would let him know.

## 2023-05-15 ENCOUNTER — Ambulatory Visit (AMBULATORY_SURGERY_CENTER): Payer: Medicare HMO | Admitting: Gastroenterology

## 2023-05-15 ENCOUNTER — Encounter: Payer: Self-pay | Admitting: Gastroenterology

## 2023-05-15 VITALS — BP 99/56 | HR 64 | Temp 97.8°F | Resp 11 | Ht 63.0 in | Wt 182.0 lb

## 2023-05-15 DIAGNOSIS — E785 Hyperlipidemia, unspecified: Secondary | ICD-10-CM | POA: Diagnosis not present

## 2023-05-15 DIAGNOSIS — Z1211 Encounter for screening for malignant neoplasm of colon: Secondary | ICD-10-CM

## 2023-05-15 DIAGNOSIS — I10 Essential (primary) hypertension: Secondary | ICD-10-CM | POA: Diagnosis not present

## 2023-05-15 MED ORDER — SODIUM CHLORIDE 0.9 % IV SOLN: 500.0000 mL | Freq: Once | INTRAVENOUS | Status: DC

## 2023-05-15 NOTE — Progress Notes (Signed)
Pt's states no medical or surgical changes since previsit or office visit. 

## 2023-05-15 NOTE — Progress Notes (Signed)
[facility] History and Physical   Primary Care Physician:  Grayce Sessions, NP   Reason for Procedure:   Colon cancer screening  Plan:    Screening colonoscopy     HPI: Richard Myers is a 66 y.o. male undergoing initial average risk screening colonoscopy.  He has no family history of colon cancer and no chronic GI symptoms. He has a history of CVA x2 and takes Plavix, last dose 8/1   Past Medical History:  Diagnosis Date   GERD (gastroesophageal reflux disease)    Hyperlipemia    Hypertension    Stroke [facility])     Past Surgical History:  Procedure Laterality Date   NO PAST SURGERIES      Prior to Admission medications   Medication Sig Start Date End Date Taking? Authorizing Provider  amLODipine (NORVASC) 10 MG tablet TAKE ONE TABLET BY MOUTH EVERY DAY 12/17/22  Yes Grayce Sessions, NP  atorvastatin (LIPITOR) 40 MG tablet TAKE 1 Tablet BY MOUTH ONCE DAILY 03/13/23  Yes Grayce Sessions, NP  Cholecalciferol (VITAMIN D3) 50 MCG (2000 UT) capsule TAKE ONE TABLET BY MOUTH EVERY DAY 07/12/22  Yes Grayce Sessions, NP  Iron, Ferrous Sulfate, 325 (65 Fe) MG TABS Take by mouth.   Yes [provider]  lisinopril-hydrochlorothiazide (ZESTORETIC) 20-25 MG tablet TAKE ONE TABLET BY MOUTH EVERY MORNING 03/01/23  Yes Grayce Sessions, NP  pantoprazole (PROTONIX) 40 MG tablet TAKE 1 Tablet BY MOUTH ONCE DAILY 12/19/22  Yes Grayce Sessions, NP  clopidogrel (PLAVIX) 75 MG tablet Take 1 tablet (75 mg total) by mouth daily. 12/17/22   Grayce Sessions, NP  Multiple Vitamin (MULTI-VITAMIN DAILY PO) Take 1 tablet by mouth daily.    [provider]    Current Outpatient Medications  Medication Sig Dispense Refill   amLODipine (NORVASC) 10 MG tablet TAKE ONE TABLET BY MOUTH EVERY DAY 90 tablet 3   atorvastatin (LIPITOR) 40 MG tablet TAKE 1 Tablet BY MOUTH ONCE DAILY 90 tablet 0   Cholecalciferol (VITAMIN D3) 50 MCG (2000 UT) capsule TAKE ONE  TABLET BY MOUTH EVERY DAY 200 capsule 0   Iron, Ferrous Sulfate, 325 (65 Fe) MG TABS Take by mouth.     lisinopril-hydrochlorothiazide (ZESTORETIC) 20-25 MG tablet TAKE ONE TABLET BY MOUTH EVERY MORNING 90 tablet 1   pantoprazole (PROTONIX) 40 MG tablet TAKE 1 Tablet BY MOUTH ONCE DAILY 90 tablet 1   clopidogrel (PLAVIX) 75 MG tablet Take 1 tablet (75 mg total) by mouth daily. 90 tablet 1   Multiple Vitamin (MULTI-VITAMIN DAILY PO) Take 1 tablet by mouth daily.     Current Facility-Administered Medications  Medication Dose Route Frequency Provider Last Rate Last Admin   0.9 %  sodium chloride infusion  500 mL Intravenous Once Jenel Lucks, MD        Allergies as of 05/15/2023   (No Known Allergies)    Family History  Problem Relation Age of Onset   Arthritis Mother    Diabetes Mother    Hypertension Mother    Alcohol abuse Father     Social History   Socioeconomic History   Marital status: Single    Spouse name: Not on file   Number of children: 5   Years of education: Not on file   Highest education level: Not on file  Occupational History   Occupation: retired  Tobacco Use   Smoking status: Never   Smokeless tobacco: Never  Vaping Use   Vaping  status: Never Used  Substance and Sexual Activity   Alcohol use: Not Currently    Comment: varies in amount   Drug use: No   Sexual activity: Yes  Other Topics Concern   Not on file  Social History Narrative   Not on file   Social Determinants of Health   Financial Resource Strain: Low Risk  (01/15/2023)   Overall Financial Resource Strain (CARDIA)    Difficulty of Paying Living Expenses: Not hard at all  Food Insecurity: No Food Insecurity (01/15/2023)   Hunger Vital Sign    Worried About Running Out of Food in the Last Year: Never true    Ran Out of Food in the Last Year: Never true  Transportation Needs: No Transportation Needs (01/15/2023)   PRAPARE - Administrator, Civil Service (Medical): No     Lack of Transportation (Non-Medical): No  Physical Activity: Insufficiently Active (01/15/2023)   Exercise Vital Sign    Days of Exercise per Week: 3 days    Minutes of Exercise per Session: 30 min  Stress: No Stress Concern Present (01/15/2023)   [employer] of Occupational Health - Occupational Stress Questionnaire    Feeling of Stress : Not at all  Social Connections: Moderately Isolated (01/15/2023)   Social Connection and Isolation Panel [NHANES]    Frequency of Communication with Friends and Family: More than three times a week    Frequency of Social Gatherings with Friends and Family: Once a week    Attends Religious Services: More than 4 times per year    Active Member of [employer] or Organizations: No    Attends Banker Meetings: Never    Marital Status: Never married  Intimate Partner Violence: Not At Risk (01/15/2023)   Humiliation, Afraid, Rape, and Kick questionnaire    Fear of Current or Ex-Partner: No    Emotionally Abused: No    Physically Abused: No    Sexually Abused: No    Review of Systems:  All other review of systems negative except as mentioned in the HPI.  Physical Exam: Vital signs BP 119/60   Pulse (!) 55   Temp 97.8 F (36.6 C)   Ht 5\' 3"  (1.6 m)   Wt 182 lb (82.6 kg)   SpO2 100%   BMI 32.24 kg/m   General:   Alert,  Well-developed, well-nourished, pleasant and cooperative in NAD Airway:  Mallampati 2 Lungs:  Clear throughout to auscultation.   Heart:  Regular rate and rhythm; no murmurs, clicks, rubs,  or gallops. Abdomen:  Soft, nontender and nondistended. Normal bowel sounds.   Neuro/Psych:  Normal mood and affect. A and O x 3   Richard Myers E. Tomasa Rand, MD [facility]

## 2023-05-15 NOTE — Op Note (Signed)
[facility] Patient Name: Richard Myers Procedure Date: 05/15/2023 10:41 AM MRN: [id number] Endoscopist: Lorin Picket E. Tomasa Myers , MD, [id number] Age: 67 Referring MD:  Date of Birth: [birth year] Gender: Male Account #: [id number] Procedure:                Colonoscopy Indications:              Screening for colorectal malignant neoplasm, This                            is the patient's first colonoscopy Medicines:                Monitored Anesthesia Care Procedure:                Pre-Anesthesia Assessment:                           - Prior to the procedure, a History and Physical                            was performed, and patient medications and                            allergies were reviewed. The patient's tolerance of                            previous anesthesia was also reviewed. The risks                            and benefits of the procedure and the sedation                            options and risks were discussed with the patient.                            All questions were answered, and informed consent                            was obtained. Prior Anticoagulants: The patient has                            taken Plavix (clopidogrel), last dose was 6 days                            prior to procedure. ASA Grade Assessment: II - A                            patient with mild systemic disease. After reviewing                            the risks and benefits, the patient was deemed in                            satisfactory condition to undergo the procedure.  After obtaining informed consent, the colonoscope                            was passed under direct vision. Throughout the                            procedure, the patient's blood pressure, pulse, and                            oxygen saturations were monitored continuously. The                            CF [id number] #[id number] was introduced through the anus                             and advanced to the the terminal ileum, with                            identification of the appendiceal orifice and IC                            valve. The colonoscopy was performed without                            difficulty. The patient tolerated the procedure                            well. The quality of the bowel preparation was                            excellent. The terminal ileum, ileocecal valve,                            appendiceal orifice, and rectum were photographed.                            The bowel preparation used was SUPREP via split                            dose instruction. Scope In: 10:50:58 AM Scope Out: 11:03:28 AM Scope Withdrawal Time: 0 hours 8 minutes 22 seconds  Total Procedure Duration: 0 hours 12 minutes 30 seconds  Findings:                 The perianal and digital rectal examinations were                            normal. Pertinent negatives include normal                            sphincter tone and no palpable rectal lesions.                            There was evidence of a previous perianal fistula  on the left buttock. No swelling, induration or                            erythema. No fluid was expressed with palpation of                            the area.                           The colon (entire examined portion) appeared normal.                           The terminal ileum appeared normal.                           The retroflexed view of the distal rectum and anal                            verge was normal and showed no anal or rectal                            abnormalities. Complications:            No immediate complications. Estimated Blood Loss:     Estimated blood loss: none. Impression:               - The entire examined colon is normal.                           - The examined portion of the ileum was normal.                           - The distal rectum and anal verge are normal on                             retroflexion view.                           - No specimens collected. Recommendation:           - Patient has a contact number available for                            emergencies. The signs and symptoms of potential                            delayed complications were discussed with the                            patient. Return to normal activities tomorrow.                            Written discharge instructions were provided to the                            patient.                           -  Resume previous diet.                           - Continue present medications.                           - Repeat colonoscopy in 10 years for screening                            purposes.                           - Ok to resume Plavix today. Richard Buenger E. Tomasa Rand, MD 05/15/2023 11:09:52 AM This report has been signed electronically.

## 2023-05-15 NOTE — Patient Instructions (Signed)
RESUME Plavix today.  Repeat colonoscopy in 10 years for screening purposes.  YOU HAD AN ENDOSCOPIC PROCEDURE TODAY AT THE [facility]:   Refer to the procedure report that was given to you for any specific questions about what was found during the examination.  If the procedure report does not answer your questions, please call your gastroenterologist to clarify.  If you requested that your care partner not be given the details of your procedure findings, then the procedure report has been included in a sealed envelope for you to review at your convenience later.  YOU SHOULD EXPECT: Some feelings of bloating in the abdomen. Passage of more gas than usual.  Walking can help get rid of the air that was put into your GI tract during the procedure and reduce the bloating. If you had a lower endoscopy (such as a colonoscopy or flexible sigmoidoscopy) you may notice spotting of blood in your stool or on the toilet paper. If you underwent a bowel prep for your procedure, you may not have a normal bowel movement for a few days.  Please Note:  You might notice some irritation and congestion in your nose or some drainage.  This is from the oxygen used during your procedure.  There is no need for concern and it should clear up in a day or so.  SYMPTOMS TO REPORT IMMEDIATELY:  Following lower endoscopy (colonoscopy or flexible sigmoidoscopy):  Excessive amounts of blood in the stool  Significant tenderness or worsening of abdominal pains  Swelling of the abdomen that is new, acute  Fever of 100F or higher  For urgent or emergent issues, a gastroenterologist can be reached at any hour by calling [phone]. Do not use MyChart messaging for urgent concerns.    DIET:  We do recommend a small meal at first, but then you may proceed to your regular diet.  Drink plenty of fluids but you should avoid alcoholic beverages for 24 hours.  ACTIVITY:  You should plan to take it easy for the rest of  today and you should NOT DRIVE or use heavy machinery until tomorrow (because of the sedation medicines used during the test).    FOLLOW UP: Our staff will call the number listed on your records the next business day following your procedure.  We will call around 7:15- 8:00 am to check on you and address any questions or concerns that you may have regarding the information given to you following your procedure. If we do not reach you, we will leave a message.     If any biopsies were taken you will be contacted by phone or by letter within the next 1-3 weeks.  Please call us at [phone] if you have not heard about the biopsies in 3 weeks.    SIGNATURES/CONFIDENTIALITY: You and/or your care partner have signed paperwork which will be entered into your electronic medical record.  These signatures attest to the fact that that the information above on your After Visit Summary has been reviewed and is understood.  Full responsibility of the confidentiality of this discharge information lies with you and/or your care-partner.

## 2023-05-15 NOTE — Progress Notes (Signed)
Vss nad trans to pacu 

## 2023-05-16 ENCOUNTER — Telehealth: Payer: Self-pay | Admitting: *Deleted

## 2023-05-16 NOTE — Telephone Encounter (Signed)
Attempted to call patient for their post-procedure follow-up call. No answer. Unable to leave voicemail. If you have any questions or concerns following your procedure at the [facility] yesterday please give [place] a call.

## 2023-06-04 ENCOUNTER — Other Ambulatory Visit (INDEPENDENT_AMBULATORY_CARE_PROVIDER_SITE_OTHER): Payer: Self-pay

## 2023-06-04 ENCOUNTER — Other Ambulatory Visit (INDEPENDENT_AMBULATORY_CARE_PROVIDER_SITE_OTHER): Payer: Self-pay | Admitting: Primary Care

## 2023-06-04 DIAGNOSIS — I10 Essential (primary) hypertension: Secondary | ICD-10-CM

## 2023-06-04 DIAGNOSIS — K219 Gastro-esophageal reflux disease without esophagitis: Secondary | ICD-10-CM

## 2023-06-04 DIAGNOSIS — Z760 Encounter for issue of repeat prescription: Secondary | ICD-10-CM

## 2023-06-04 MED ORDER — LISINOPRIL-HYDROCHLOROTHIAZIDE 20-25 MG PO TABS: 1.0000 | ORAL_TABLET | Freq: Every morning | ORAL | 0 refills | Status: DC

## 2023-06-04 MED ORDER — PANTOPRAZOLE SODIUM 40 MG PO TBEC
40.0000 mg | DELAYED_RELEASE_TABLET | Freq: Every day | ORAL | 0 refills | Status: DC
Start: 2023-06-04 — End: 2023-09-12

## 2023-06-04 NOTE — Telephone Encounter (Signed)
Nurse 30 day n needs appt

## 2023-06-17 DIAGNOSIS — Z008 Encounter for other general examination: Secondary | ICD-10-CM | POA: Diagnosis not present

## 2023-06-17 DIAGNOSIS — Z833 Family history of diabetes mellitus: Secondary | ICD-10-CM | POA: Diagnosis not present

## 2023-06-17 DIAGNOSIS — Z7902 Long term (current) use of antithrombotics/antiplatelets: Secondary | ICD-10-CM | POA: Diagnosis not present

## 2023-06-17 DIAGNOSIS — E669 Obesity, unspecified: Secondary | ICD-10-CM | POA: Diagnosis not present

## 2023-06-17 DIAGNOSIS — Z5986 Financial insecurity: Secondary | ICD-10-CM | POA: Diagnosis not present

## 2023-06-17 DIAGNOSIS — E785 Hyperlipidemia, unspecified: Secondary | ICD-10-CM | POA: Diagnosis not present

## 2023-06-17 DIAGNOSIS — F1021 Alcohol dependence, in remission: Secondary | ICD-10-CM | POA: Diagnosis not present

## 2023-06-17 DIAGNOSIS — K219 Gastro-esophageal reflux disease without esophagitis: Secondary | ICD-10-CM | POA: Diagnosis not present

## 2023-06-17 DIAGNOSIS — R7303 Prediabetes: Secondary | ICD-10-CM | POA: Diagnosis not present

## 2023-06-17 DIAGNOSIS — Z6831 Body mass index (BMI) 31.0-31.9, adult: Secondary | ICD-10-CM | POA: Diagnosis not present

## 2023-06-17 DIAGNOSIS — Z823 Family history of stroke: Secondary | ICD-10-CM | POA: Diagnosis not present

## 2023-06-17 DIAGNOSIS — Z8673 Personal history of transient ischemic attack (TIA), and cerebral infarction without residual deficits: Secondary | ICD-10-CM | POA: Diagnosis not present

## 2023-06-17 DIAGNOSIS — I10 Essential (primary) hypertension: Secondary | ICD-10-CM | POA: Diagnosis not present

## 2023-07-11 ENCOUNTER — Other Ambulatory Visit (INDEPENDENT_AMBULATORY_CARE_PROVIDER_SITE_OTHER): Payer: Self-pay

## 2023-07-11 DIAGNOSIS — I639 Cerebral infarction, unspecified: Secondary | ICD-10-CM

## 2023-07-11 MED ORDER — CLOPIDOGREL BISULFATE 75 MG PO TABS
75.0000 mg | ORAL_TABLET | Freq: Every day | ORAL | 0 refills | Status: DC
Start: 2023-07-11 — End: 2023-09-19

## 2023-07-24 ENCOUNTER — Encounter (INDEPENDENT_AMBULATORY_CARE_PROVIDER_SITE_OTHER): Payer: Self-pay | Admitting: Primary Care

## 2023-07-24 ENCOUNTER — Ambulatory Visit (INDEPENDENT_AMBULATORY_CARE_PROVIDER_SITE_OTHER): Payer: Medicare HMO | Admitting: Primary Care

## 2023-07-24 DIAGNOSIS — Z760 Encounter for issue of repeat prescription: Secondary | ICD-10-CM | POA: Diagnosis not present

## 2023-07-24 DIAGNOSIS — I10 Essential (primary) hypertension: Secondary | ICD-10-CM

## 2023-07-24 MED ORDER — AMLODIPINE BESYLATE 5 MG PO TABS
5.0000 mg | ORAL_TABLET | Freq: Every day | ORAL | 1 refills | Status: DC
Start: 2023-07-24 — End: 2023-10-25

## 2023-07-24 MED ORDER — LISINOPRIL-HYDROCHLOROTHIAZIDE 20-25 MG PO TABS
1.0000 | ORAL_TABLET | Freq: Every morning | ORAL | 1 refills | Status: DC
Start: 2023-07-24 — End: 2023-10-25

## 2023-07-24 NOTE — Progress Notes (Signed)
[facility]  Richard Myers, is a 67 y.o. male  [id number]  [id number]  DOB - 25-Apr-1956  Chief Complaint  Patient presents with   Hypertension       Subjective:   Richard Myers is a 67 y.o. male here today for a follow up visit HTN. Patient has No headache, No chest pain, No abdominal pain - No Nausea, No new weakness tingling or numbness, No Cough - shortness of breath  No problems updated.  No Known Allergies  Past Medical History:  Diagnosis Date   GERD (gastroesophageal reflux disease)    Hyperlipemia    Hypertension    Stroke [facility])     Current Outpatient Medications on File Prior to Visit  Medication Sig Dispense Refill   atorvastatin (LIPITOR) 40 MG tablet TAKE 1 Tablet BY MOUTH ONCE DAILY 90 tablet 0   Cholecalciferol (VITAMIN D3) 50 MCG (2000 UT) capsule TAKE ONE TABLET BY MOUTH EVERY DAY 200 capsule 0   clopidogrel (PLAVIX) 75 MG tablet Take 1 tablet (75 mg total) by mouth daily. 90 tablet 0   Iron, Ferrous Sulfate, 325 (65 Fe) MG TABS Take by mouth.     Multiple Vitamin (MULTI-VITAMIN DAILY PO) Take 1 tablet by mouth daily.     pantoprazole (PROTONIX) 40 MG tablet Take 1 tablet (40 mg total) by mouth daily. Needs an appt for future refills 30 tablet 0   No current facility-administered medications on file prior to visit.    Objective:   Vitals:   07/24/23 1009  BP: 113/77  Pulse: 85  Resp: 16  SpO2: 97%  Weight: 178 lb 9.6 oz (81 kg)    Comprehensive ROS Pertinent positive and negative noted in HPI   Exam General appearance : Awake, alert, not in any distress. Speech Clear. Not toxic looking HEENT: Atraumatic and Normocephalic, pupils equally reactive to light and accomodation Neck: Supple, no JVD. No cervical lymphadenopathy.  Chest: Good air entry bilaterally, no added sounds  CVS: S1 S2 regular, no murmurs.  Abdomen: Bowel sounds present, Non tender and not distended with no gaurding, rigidity or  rebound. Extremities: B/L Lower Ext shows no edema, both legs are warm to touch Neurology: Awake alert, and oriented X 3, CN II-XII intact, Non focal Skin: No Rash  Data Review Lab Results  Component Value Date   HGBA1C 5.9 (H) 12/17/2022   HGBA1C 5.9 (A) 03/14/2021   HGBA1C 6.2 (H) 08/08/2020    Assessment & Plan  Richard Myers was seen today for hypertension.  Diagnoses and all orders for this visit:  Essential (primary) hypertension Well controlled decrease amlodipine to 5mg   -     lisinopril-hydrochlorothiazide (ZESTORETIC) 20-25 MG tablet; Take 1 tablet by mouth every morning.  Encounter for issue of repeat prescription -     lisinopril-hydrochlorothiazide (ZESTORETIC) 20-25 MG tablet; Take 1 tablet by mouth every morning.      Patient have been counseled extensively about nutrition and exercise. Other issues discussed during this visit include: low cholesterol diet, weight control and daily exercise, foot care, annual eye examinations at Ophthalmology, importance of adherence with medications and regular follow-up. We also discussed long term complications of uncontrolled diabetes and hypertension.   Return in about 3 months (around 10/24/2023) for medical conditions.  The patient was given clear instructions to go to ER or return to medical center if symptoms don't improve, worsen or new problems develop. The patient verbalized understanding. The patient was told to call to get lab results if they haven't heard  anything in the next week.   This note has been created with Education officer, environmental. Any transcriptional errors are unintentional.   Grayce Sessions, NP 07/24/2023, 10:35 AM

## 2023-09-02 ENCOUNTER — Other Ambulatory Visit (INDEPENDENT_AMBULATORY_CARE_PROVIDER_SITE_OTHER): Payer: Self-pay | Admitting: Primary Care

## 2023-09-12 ENCOUNTER — Other Ambulatory Visit (INDEPENDENT_AMBULATORY_CARE_PROVIDER_SITE_OTHER): Payer: Self-pay | Admitting: Primary Care

## 2023-09-12 DIAGNOSIS — K219 Gastro-esophageal reflux disease without esophagitis: Secondary | ICD-10-CM

## 2023-09-12 DIAGNOSIS — Z760 Encounter for issue of repeat prescription: Secondary | ICD-10-CM

## 2023-09-18 ENCOUNTER — Other Ambulatory Visit (INDEPENDENT_AMBULATORY_CARE_PROVIDER_SITE_OTHER): Payer: Self-pay | Admitting: Primary Care

## 2023-09-18 DIAGNOSIS — I639 Cerebral infarction, unspecified: Secondary | ICD-10-CM

## 2023-09-19 NOTE — Telephone Encounter (Signed)
Requested Prescriptions  Pending Prescriptions Disp Refills   clopidogrel (PLAVIX) 75 MG tablet [Pharmacy Med Name: clopidogrel 75 mg tablet] 90 tablet 0    Sig: TAKE 1 Tablet BY MOUTH ONCE DAILY     Hematology: Antiplatelets - clopidogrel Failed - 09/19/2023  9:25 AM      Failed - HCT in normal range and within 180 days    Hematocrit  Date Value Ref Range Status  12/17/2022 44.2 37.5 - 51.0 % Final         Failed - HGB in normal range and within 180 days    Hemoglobin  Date Value Ref Range Status  12/17/2022 14.2 13.0 - 17.7 g/dL Final         Failed - PLT in normal range and within 180 days    Platelets  Date Value Ref Range Status  12/17/2022 211 150 - 450 x10E3/uL Final         Passed - Cr in normal range and within 360 days    Creatinine, Ser  Date Value Ref Range Status  12/17/2022 0.96 0.76 - 1.27 mg/dL Final         Passed - Valid encounter within last 6 months    Recent Outpatient Visits           1 month ago Essential (primary) hypertension   [place] [facility] Grayce Sessions, NP   9 months ago Colon cancer screening   [place] [facility] Grayce Sessions, NP   1 year ago Essential hypertension   [place] [facility] Grayce Sessions, NP   2 years ago Need for immunization against influenza   [place] [facility] Grayce Sessions, NP   2 years ago Prediabetes   [place] [facility] Grayce Sessions, NP       Future Appointments             In 1 month Randa Evens, Kinnie Scales, NP [place] [facility]

## 2023-10-24 ENCOUNTER — Encounter (INDEPENDENT_AMBULATORY_CARE_PROVIDER_SITE_OTHER): Payer: Self-pay

## 2023-10-24 ENCOUNTER — Ambulatory Visit (INDEPENDENT_AMBULATORY_CARE_PROVIDER_SITE_OTHER): Payer: Medicare HMO | Admitting: Primary Care

## 2023-10-25 ENCOUNTER — Encounter (INDEPENDENT_AMBULATORY_CARE_PROVIDER_SITE_OTHER): Payer: Self-pay | Admitting: Primary Care

## 2023-10-25 ENCOUNTER — Ambulatory Visit (INDEPENDENT_AMBULATORY_CARE_PROVIDER_SITE_OTHER): Payer: 59 | Admitting: Primary Care

## 2023-10-25 VITALS — BP 120/79 | HR 89 | Resp 16 | Ht 63.5 in | Wt 184.0 lb

## 2023-10-25 DIAGNOSIS — D649 Anemia, unspecified: Secondary | ICD-10-CM

## 2023-10-25 DIAGNOSIS — R7303 Prediabetes: Secondary | ICD-10-CM

## 2023-10-25 DIAGNOSIS — Z760 Encounter for issue of repeat prescription: Secondary | ICD-10-CM

## 2023-10-25 DIAGNOSIS — K219 Gastro-esophageal reflux disease without esophagitis: Secondary | ICD-10-CM | POA: Diagnosis not present

## 2023-10-25 DIAGNOSIS — E559 Vitamin D deficiency, unspecified: Secondary | ICD-10-CM

## 2023-10-25 DIAGNOSIS — D1721 Benign lipomatous neoplasm of skin and subcutaneous tissue of right arm: Secondary | ICD-10-CM

## 2023-10-25 DIAGNOSIS — Z1159 Encounter for screening for other viral diseases: Secondary | ICD-10-CM | POA: Diagnosis not present

## 2023-10-25 DIAGNOSIS — I639 Cerebral infarction, unspecified: Secondary | ICD-10-CM | POA: Diagnosis not present

## 2023-10-25 DIAGNOSIS — I10 Essential (primary) hypertension: Secondary | ICD-10-CM | POA: Diagnosis not present

## 2023-10-25 DIAGNOSIS — Z23 Encounter for immunization: Secondary | ICD-10-CM | POA: Diagnosis not present

## 2023-10-25 MED ORDER — CLOPIDOGREL BISULFATE 75 MG PO TABS: 75.0000 mg | ORAL_TABLET | Freq: Every day | ORAL | 1 refills | Status: DC

## 2023-10-25 MED ORDER — PANTOPRAZOLE SODIUM 40 MG PO TBEC: 40.0000 mg | DELAYED_RELEASE_TABLET | Freq: Every day | ORAL | 1 refills | Status: DC

## 2023-10-25 MED ORDER — IRON (FERROUS SULFATE) 325 (65 FE) MG PO TABS: 325.0000 mg | ORAL_TABLET | Freq: Every day | ORAL | 1 refills | Status: AC

## 2023-10-25 MED ORDER — AMLODIPINE BESYLATE 5 MG PO TABS: 5.0000 mg | ORAL_TABLET | Freq: Every day | ORAL | 1 refills | Status: DC

## 2023-10-25 MED ORDER — VITAMIN D3 50 MCG (2000 UT) PO CAPS: 2000.0000 [IU] | ORAL_CAPSULE | Freq: Every day | ORAL | 1 refills | Status: DC

## 2023-10-25 MED ORDER — LISINOPRIL-HYDROCHLOROTHIAZIDE 20-25 MG PO TABS: 1.0000 | ORAL_TABLET | Freq: Every morning | ORAL | 1 refills | Status: DC

## 2023-10-25 NOTE — Progress Notes (Unsigned)
[facility]  Richard Myers, is a 68 y.o. male  [id number]  [id number]  DOB - December 23, 1955  Chief Complaint  Patient presents with   Hypertension   Cyst    Right arm Pt states he use to donate plasma 2 years ago and he was told that it was fine but it never went away       Subjective:   Richard Myers is a 68 y.o. male here today for a follow up visit. Patient has No headache, No chest pain, No abdominal pain - No Nausea, No new weakness tingling or numbness, No Cough - shortness of breath HPI  No problems updated.  Comprehensive ROS Pertinent positive and negative noted in HPI   No Known Allergies  Past Medical History:  Diagnosis Date   GERD (gastroesophageal reflux disease)    Hyperlipemia    Hypertension    Stroke [facility])     Current Outpatient Medications on File Prior to Visit  Medication Sig Dispense Refill   atorvastatin (LIPITOR) 40 MG tablet TAKE 1 Tablet BY MOUTH ONCE DAILY 90 tablet 0   Multiple Vitamin (MULTI-VITAMIN DAILY PO) Take 1 tablet by mouth daily.     No current facility-administered medications on file prior to visit.   Health Maintenance  Topic Date Due   Hepatitis C Screening  Never done   Zoster (Shingles) Vaccine (1 of 2) Never done   Medicare Annual Wellness Visit  01/15/2024   DTaP/Tdap/Td vaccine (2 - Td or Tdap) 11/14/2030   Colon Cancer Screening  05/14/2033   Pneumonia Vaccine  Completed   Flu Shot  Completed   COVID-19 Vaccine  Completed   HPV Vaccine  Aged Out    Objective:   Vitals:   10/25/23 0937  BP: 120/79  Pulse: 89  Resp: 16  SpO2: 99%  Weight: 184 lb (83.5 kg)  Height: 5' 3.5" (1.613 m)   {Vitals History (Optional):23777}  Physical Exam  {Labs (Optional):23779}  Assessment & Plan  Encounter for immunization -     Pneumococcal conjugate vaccine 20-valent  Essential (primary) hypertension -     CMP14+EGFR; Future -     amLODIPine Besylate; Take 1 tablet (5 mg total) by mouth  daily.  Dispense: 90 tablet; Refill: 1 -     Lisinopril-hydroCHLOROthiazide; Take 1 tablet by mouth every morning.  Dispense: 90 tablet; Refill: 1  Encounter for issue of repeat prescription -     amLODIPine Besylate; Take 1 tablet (5 mg total) by mouth daily.  Dispense: 90 tablet; Refill: 1 -     Lisinopril-hydroCHLOROthiazide; Take 1 tablet by mouth every morning.  Dispense: 90 tablet; Refill: 1  Vitamin D deficiency -     Vitamin D3; Take 1 capsule (2,000 Units total) by mouth daily.  Dispense: 90 capsule; Refill: 1  Acute CVA (cerebrovascular accident) (HCC) -     Lipid panel; Future -     Clopidogrel Bisulfate; Take 1 tablet (75 mg total) by mouth daily.  Dispense: 90 tablet; Refill: 1  Medication refill -     Pantoprazole Sodium; Take 1 tablet (40 mg total) by mouth daily.  Dispense: 90 tablet; Refill: 1  Gastroesophageal reflux disease, unspecified whether esophagitis present -     Pantoprazole Sodium; Take 1 tablet (40 mg total) by mouth daily.  Dispense: 90 tablet; Refill: 1  Anemia, unspecified type -     CBC with Differential/Platelet; Future -     Iron (Ferrous Sulfate); Take 325 mg by mouth daily.  Dispense:  90 tablet; Refill: 1  Prediabetes -     Hemoglobin A1c; Future -     CBC with Differential/Platelet; Future  Encounter for HCV screening test for low risk patient -     HCV Ab w Reflex to Quant PCR; Future     Patient have been counseled extensively about nutrition and exercise. Other issues discussed during this visit include: low cholesterol diet, weight control and daily exercise, foot care, annual eye examinations at Ophthalmology, importance of adherence with medications and regular follow-up. We also discussed long term complications of uncontrolled diabetes and hypertension.   No follow-ups on file.  The patient was given clear instructions to go to ER or return to medical center if symptoms don't improve, worsen or new problems develop. The patient  verbalized understanding. The patient was told to call to get lab results if they haven't heard anything in the next week.   This note has been created with Education officer, environmental. Any transcriptional errors are unintentional.   Grayce Sessions, NP 10/25/2023, 10:21 AM

## 2023-10-25 NOTE — Patient Instructions (Signed)
Pneumococcal Conjugate Vaccine: What You Need to Know Many vaccine information statements are available in Spanish and other languages. See PromoAge.com.br. 1. Why get vaccinated? Pneumococcal conjugate vaccine can prevent pneumococcal disease. Pneumococcal disease refers to any illness caused by pneumococcal bacteria. These bacteria can cause many types of illnesses, including pneumonia, which is an infection of the lungs. Pneumococcal bacteria are one of the most common causes of pneumonia. Besides pneumonia, pneumococcal bacteria can also cause: Ear infections Sinus infections Meningitis (infection of the tissue covering the brain and spinal cord) Bacteremia (infection of the blood) Anyone can get pneumococcal disease, but children under 13 years old, people with certain medical conditions or other risk factors, and adults 65 years or older are at the highest risk. Most pneumococcal infections are mild. However, some can result in long-term problems, such as brain damage or hearing loss. Meningitis, bacteremia, and pneumonia caused by pneumococcal disease can be fatal. 2. Pneumococcal conjugate vaccine Pneumococcal conjugate vaccine helps protect against bacteria that cause pneumococcal disease. There are three pneumococcal conjugate vaccines (PCV13, PCV15, and PCV20). The different vaccines are recommended for different people based on age and medical status. Your health care provider can help you determine which type of pneumococcal conjugate vaccine, and how many doses, you should receive. Infants and young children usually need 4 doses of pneumococcal conjugate vaccine. These doses are recommended at 2, 4, 6, and 58-29 months of age. Older children and adolescents might need pneumococcal conjugate vaccine depending on their age and medical conditions or other risk factors if they did not receive the recommended doses as infants or young children. Adults 19 through 11 years old with certain  medical conditions or other risk factors who have not already received pneumococcal conjugate vaccine should receive pneumococcal conjugate vaccine. Adults 65 years or older who have not previously received pneumococcal conjugate vaccine should receive pneumococcal conjugate vaccine. Some people with certain medical conditions are also recommended to receive pneumococcal polysaccharide vaccine (a different type of pneumococcal vaccine known as PPSV23). Some adults who have previously received a pneumococcal conjugate vaccine may be recommended to receive another pneumococcal conjugate vaccine. 3. Talk with your health care provider Tell your vaccination provider if the person getting the vaccine: Has had an allergic reaction after a previous dose of any type of pneumococcal conjugate vaccine (PCV13, PCV15, PCV20, or an earlier pneumococcal conjugate vaccine known as PCV7), or to any vaccine containing diphtheria toxoid (for example, DTaP), or has any severe, life-threatening allergies In some cases, your health care provider may decide to postpone pneumococcal conjugate vaccination until a future visit. People with minor illnesses, such as a cold, may be vaccinated. People who are moderately or severely ill should usually wait until they recover. Your health care provider can give you more information. 4. Risks of a vaccine reaction Redness, swelling, pain, or tenderness where the shot is given, and fever, loss of appetite, fussiness (irritability), feeling tired, headache, muscle aches, joint pain, and chills can happen after pneumococcal conjugate vaccination. Young children may be at increased risk for seizures caused by fever after a pneumococcal conjugate vaccine if it is administered at the same time as inactivated influenza vaccine. Ask your health care provider for more information. People sometimes faint after medical procedures, including vaccination. Tell your provider if you feel dizzy or  have vision changes or ringing in the ears. As with any medicine, there is a very remote chance of a vaccine causing a severe allergic reaction, other serious injury, or death. 5.  What if there is a serious problem? An allergic reaction could occur after the vaccinated person leaves the clinic. If you see signs of a severe allergic reaction (hives, swelling of the face and throat, difficulty breathing, a fast heartbeat, dizziness, or weakness), call 9-1-1 and get the person to the nearest hospital. For other signs that concern you, call your health care provider. Adverse reactions should be reported to the Vaccine Adverse Event Reporting System (VAERS). Your health care provider will usually file this report, or you can do it yourself. Visit the VAERS website at www.vaers.LAgents.no or call 913-439-3438. VAERS is only for reporting reactions, and VAERS staff members do not give medical advice. 6. The National Vaccine Injury Compensation Program The [employer] Vaccine Injury Compensation Program (VICP) is a federal program that was created to compensate people who may have been injured by certain vaccines. Claims regarding alleged injury or death due to vaccination have a time limit for filing, which may be as short as two years. Visit the VICP website at SpiritualWord.at or call 860 253 0829 to learn about the program and about filing a claim. 7. How can I learn more? Ask your health care provider. Call your local or state health department. Visit the website of the Food and Drug Administration (FDA) for vaccine package inserts and additional information at FinderList.no. Contact the Centers for Disease Control and Prevention (CDC): Call 4055360835 (1-800-CDC-INFO) or Visit CDC's website at PicCapture.uy. Source: CDC Vaccine Information Statement (Interim) Pneumococcal Conjugate Vaccine (02/16/2022) This same material is available at  FootballExhibition.com.br for no charge. This information is not intended to replace advice given to you by your health care provider. Make sure you discuss any questions you have with your health care provider. Document Revised: 01/09/2023 Document Reviewed: 10/15/2022 Elsevier Patient Education  2024 [employer].

## 2023-10-28 ENCOUNTER — Other Ambulatory Visit (INDEPENDENT_AMBULATORY_CARE_PROVIDER_SITE_OTHER): Payer: Medicare HMO

## 2023-10-29 ENCOUNTER — Other Ambulatory Visit (INDEPENDENT_AMBULATORY_CARE_PROVIDER_SITE_OTHER): Payer: 59

## 2023-10-29 DIAGNOSIS — D649 Anemia, unspecified: Secondary | ICD-10-CM

## 2023-10-29 DIAGNOSIS — I10 Essential (primary) hypertension: Secondary | ICD-10-CM

## 2023-10-29 DIAGNOSIS — I639 Cerebral infarction, unspecified: Secondary | ICD-10-CM

## 2023-10-29 DIAGNOSIS — R7303 Prediabetes: Secondary | ICD-10-CM | POA: Diagnosis not present

## 2023-10-29 DIAGNOSIS — Z1159 Encounter for screening for other viral diseases: Secondary | ICD-10-CM

## 2023-11-01 LAB — CMP14+EGFR
ALT: 14 IU/L (ref 0–44)
AST: 17 IU/L (ref 0–40)
Albumin: 3.9 g/dL (ref 3.9–4.9)
Alkaline Phosphatase: 112 IU/L (ref 44–121)
BUN/Creatinine Ratio: 14 (ref 10–24)
BUN: 14 mg/dL (ref 8–27)
Bilirubin Total: 0.3 mg/dL (ref 0.0–1.2)
CO2: 22 mmol/L (ref 20–29)
Calcium: 9.4 mg/dL (ref 8.6–10.2)
Chloride: 100 mmol/L (ref 96–106)
Creatinine, Ser: 1.03 mg/dL (ref 0.76–1.27)
Globulin, Total: 2.8 g/dL (ref 1.5–4.5)
Glucose: 101 mg/dL — ABNORMAL HIGH (ref 70–99)
Potassium: 4.3 mmol/L (ref 3.5–5.2)
Sodium: 141 mmol/L (ref 134–144)
Total Protein: 6.7 g/dL (ref 6.0–8.5)
eGFR: 80 mL/min/1.73

## 2023-11-01 LAB — LIPID PANEL
Chol/HDL Ratio: 3.1 {ratio} (ref 0.0–5.0)
Cholesterol, Total: 140 mg/dL (ref 100–199)
HDL: 45 mg/dL (ref >39)
LDL Chol Calc (NIH): 82 mg/dL (ref 0–99)
Triglycerides: 62 mg/dL (ref 0–149)
VLDL Cholesterol Cal: 13 mg/dL (ref 5–40)

## 2023-11-01 LAB — HEMOGLOBIN A1C
Est. average glucose Bld gHb Est-mCnc: 128 mg/dL
Hgb A1c MFr Bld: 6.1 % — ABNORMAL HIGH (ref 4.8–5.6)

## 2023-11-01 LAB — CBC WITH DIFFERENTIAL/PLATELET
Basophils Absolute: 0 10*3/uL (ref 0.0–0.2)
Basos: 1 %
EOS (ABSOLUTE): 0.2 10*3/uL (ref 0.0–0.4)
Eos: 5 %
Hematocrit: 41.6 % (ref 37.5–51.0)
Hemoglobin: 13.7 g/dL (ref 13.0–17.7)
Immature Grans (Abs): 0 10*3/uL (ref 0.0–0.1)
Immature Granulocytes: 0 %
Lymphocytes Absolute: 2.1 10*3/uL (ref 0.7–3.1)
Lymphs: 45 %
MCH: 23.8 pg — ABNORMAL LOW (ref 26.6–33.0)
MCHC: 32.9 g/dL (ref 31.5–35.7)
MCV: 72 fL — ABNORMAL LOW (ref 79–97)
Monocytes Absolute: 0.3 10*3/uL (ref 0.1–0.9)
Monocytes: 7 %
Neutrophils Absolute: 1.9 10*3/uL (ref 1.4–7.0)
Neutrophils: 42 %
Platelets: 236 10*3/uL (ref 150–450)
RBC: 5.75 x10E6/uL (ref 4.14–5.80)
RDW: 15.4 % (ref 11.6–15.4)
WBC: 4.6 10*3/uL (ref 3.4–10.8)

## 2023-11-01 LAB — HCV AB W REFLEX TO QUANT PCR: HCV Ab: NONREACTIVE

## 2023-11-01 LAB — HCV INTERPRETATION

## 2023-11-15 ENCOUNTER — Encounter (INDEPENDENT_AMBULATORY_CARE_PROVIDER_SITE_OTHER): Payer: Self-pay

## 2023-11-22 ENCOUNTER — Telehealth: Payer: Self-pay

## 2023-11-22 NOTE — Telephone Encounter (Signed)
Noted.    Copied from CRM [id number]. Topic: Clinical - Lab/Test Results >> Nov 22, 2023  3:56 PM Phill Myron wrote: Pt Brackens called stated he received your letter in the mail. Information was read Verbatim:   Grayce Sessions, NP 11/11/2023  2:08 PM EST   Your result is normal. You are prediabetic .  Prediabetes is 5.7-6.4 monitor carbohydrates -rice, potatoes, tortillas, breads, pasta, sweets, sodas.  Increase exercising to help maintain appropriate weight. Your results done for screening hepatitis is negative . Cholesterol remains Great  He said thank and had no questions

## 2023-11-28 ENCOUNTER — Other Ambulatory Visit (INDEPENDENT_AMBULATORY_CARE_PROVIDER_SITE_OTHER): Payer: Self-pay | Admitting: Primary Care

## 2023-12-12 ENCOUNTER — Encounter (INDEPENDENT_AMBULATORY_CARE_PROVIDER_SITE_OTHER): Payer: 59

## 2024-02-17 ENCOUNTER — Other Ambulatory Visit (INDEPENDENT_AMBULATORY_CARE_PROVIDER_SITE_OTHER): Payer: Self-pay | Admitting: Primary Care

## 2024-02-17 DIAGNOSIS — Z760 Encounter for issue of repeat prescription: Secondary | ICD-10-CM

## 2024-02-17 DIAGNOSIS — K219 Gastro-esophageal reflux disease without esophagitis: Secondary | ICD-10-CM

## 2024-02-19 NOTE — Telephone Encounter (Signed)
 Request is too soon for refill, refilled 10/25/23 for 90 and 1 RF.  Requested Prescriptions  Pending Prescriptions Disp Refills   pantoprazole  (PROTONIX ) 40 MG tablet [Pharmacy Med Name: pantoprazole  40 mg tablet,delayed release] 90 tablet 1    Sig: TAKE 1 Tablet BY MOUTH ONCE DAILY     Gastroenterology: Proton Pump Inhibitors Passed - 02/19/2024  9:14 AM      Passed - Valid encounter within last 12 months    Recent Outpatient Visits           3 months ago Encounter for immunization   [place] [facility] Marius Siemens, NP   7 months ago Essential (primary) hypertension   [place] [facility] Marius Siemens, NP   1 year ago Colon cancer screening   [place] [facility] Marius Siemens, NP   1 year ago Essential hypertension   [place] [facility] Marius Siemens, NP   2 years ago Need for immunization against influenza   [place] [facility] Marius Siemens, NP

## 2024-03-17 ENCOUNTER — Telehealth (INDEPENDENT_AMBULATORY_CARE_PROVIDER_SITE_OTHER): Payer: Self-pay | Admitting: Primary Care

## 2024-03-17 NOTE — Telephone Encounter (Signed)
 Called pt to confirm appt LVM

## 2024-03-18 ENCOUNTER — Encounter (INDEPENDENT_AMBULATORY_CARE_PROVIDER_SITE_OTHER): Payer: Self-pay | Admitting: Primary Care

## 2024-03-18 ENCOUNTER — Ambulatory Visit (INDEPENDENT_AMBULATORY_CARE_PROVIDER_SITE_OTHER): Admitting: Primary Care

## 2024-03-18 VITALS — BP 123/82 | HR 84 | Resp 16 | Ht 63.5 in | Wt 181.0 lb

## 2024-03-18 DIAGNOSIS — R7303 Prediabetes: Secondary | ICD-10-CM

## 2024-03-18 DIAGNOSIS — Z760 Encounter for issue of repeat prescription: Secondary | ICD-10-CM | POA: Diagnosis not present

## 2024-03-18 DIAGNOSIS — I10 Essential (primary) hypertension: Secondary | ICD-10-CM | POA: Diagnosis not present

## 2024-03-18 DIAGNOSIS — R351 Nocturia: Secondary | ICD-10-CM | POA: Diagnosis not present

## 2024-03-18 DIAGNOSIS — I639 Cerebral infarction, unspecified: Secondary | ICD-10-CM | POA: Diagnosis not present

## 2024-03-18 DIAGNOSIS — E559 Vitamin D deficiency, unspecified: Secondary | ICD-10-CM

## 2024-03-18 DIAGNOSIS — E785 Hyperlipidemia, unspecified: Secondary | ICD-10-CM

## 2024-03-18 MED ORDER — ZOSTER VAC RECOMB ADJUVANTED 50 MCG/0.5ML IM SUSR: 0.5000 mL | Freq: Once | INTRAMUSCULAR | 0 refills | Status: AC

## 2024-03-18 MED ORDER — CLOPIDOGREL BISULFATE 75 MG PO TABS: 75.0000 mg | ORAL_TABLET | Freq: Every day | ORAL | 1 refills | Status: DC

## 2024-03-18 MED ORDER — LISINOPRIL-HYDROCHLOROTHIAZIDE 20-25 MG PO TABS: 1.0000 | ORAL_TABLET | Freq: Every morning | ORAL | 1 refills | Status: AC

## 2024-03-18 MED ORDER — AMLODIPINE BESYLATE 5 MG PO TABS: 5.0000 mg | ORAL_TABLET | Freq: Every day | ORAL | 1 refills | Status: AC

## 2024-03-18 MED ORDER — VITAMIN D3 50 MCG (2000 UT) PO CAPS: 2000.0000 [IU] | ORAL_CAPSULE | Freq: Every day | ORAL | 1 refills | Status: AC

## 2024-03-18 NOTE — Progress Notes (Signed)
 [facility]  Richard Myers, is a 68 y.o. male  [id number]  [id number]  DOB - 1955/11/04  Chief Complaint  Patient presents with  . Hypertension  . Prediabetes       Subjective:   Richard Myers is a 68 y.o. male here today for the management of comorbidities.  Hypertension- patient has No headache, No chest pain, No abdominal pain - No Nausea, No new weakness tingling or numbness, No Cough - shortness of breath Prediabetes last A1c 6.1 Denies polyuria, polydipsia, polyphasia or vision changes.  Does not check blood sugars at home.   No problems updated.  Comprehensive ROS Pertinent positive and negative noted in HPI   No Known Allergies  Past Medical History:  Diagnosis Date  . GERD (gastroesophageal reflux disease)   . Hyperlipemia   . Hypertension   . Stroke [facility])     Current Outpatient Medications on File Prior to Visit  Medication Sig Dispense Refill  . amLODipine  (NORVASC ) 5 MG tablet Take 1 tablet (5 mg total) by mouth daily. 90 tablet 1  . atorvastatin  (LIPITOR) 40 MG tablet TAKE 1 Tablet BY MOUTH ONCE DAILY 90 tablet PRN  . Cholecalciferol (VITAMIN D3) 50 MCG (2000 UT) capsule Take 1 capsule (2,000 Units total) by mouth daily. 90 capsule 1  . clopidogrel  (PLAVIX ) 75 MG tablet Take 1 tablet (75 mg total) by mouth daily. 90 tablet 1  . Iron , Ferrous Sulfate , 325 (65 Fe) MG TABS Take 325 mg by mouth daily. 90 tablet 1  . lisinopril -hydrochlorothiazide  (ZESTORETIC ) 20-25 MG tablet Take 1 tablet by mouth every morning. 90 tablet 1  . Multiple Vitamin (MULTI-VITAMIN DAILY PO) Take 1 tablet by mouth daily.    . pantoprazole  (PROTONIX ) 40 MG tablet Take 1 tablet (40 mg total) by mouth daily. 90 tablet 1   No current facility-administered medications on file prior to visit.   Health Maintenance  Topic Date Due  . Zoster (Shingles) Vaccine (1 of 2) Never done  . Medicare Annual Wellness Visit  01/15/2024  . COVID-19 Vaccine (5 - 2024-25  season) 01/20/2024  . Flu Shot  05/08/2024  . DTaP/Tdap/Td vaccine (2 - Td or Tdap) 11/14/2030  . Colon Cancer Screening  05/14/2033  . Pneumonia Vaccine  Completed  . Hepatitis C Screening  Completed  . HPV Vaccine  Aged Out  . Meningitis B Vaccine  Aged Out    Objective:   Vitals:   03/18/24 0920  BP: 123/82  Pulse: 84  Resp: 16  SpO2: 98%  Weight: 181 lb (82.1 kg)  Height: 5' 3.5 (1.613 m)   BP Readings from Last 3 Encounters:  03/18/24 123/82  10/25/23 120/79  07/24/23 113/77      Physical Exam Vitals reviewed.  Constitutional:      Appearance: He is obese.  HENT:     Head: Normocephalic.     Right Ear: Tympanic membrane and external ear normal.     Left Ear: Tympanic membrane and external ear normal.     Nose: Nose normal.  Eyes:     Extraocular Movements: Extraocular movements intact.     Pupils: Pupils are equal, round, and reactive to light.  Cardiovascular:     Rate and Rhythm: Normal rate and regular rhythm.  Pulmonary:     Effort: Pulmonary effort is normal.     Breath sounds: Normal breath sounds.  Abdominal:     General: Bowel sounds are normal. There is distension.     Palpations: Abdomen is  soft.  Musculoskeletal:        General: Normal range of motion.     Cervical back: Normal range of motion and neck supple.  Skin:    General: Skin is warm and dry.  Neurological:     Mental Status: He is oriented to person, place, and time.  Psychiatric:        Mood and Affect: Mood normal.        Behavior: Behavior normal.        Thought Content: Thought content normal.        Judgment: Judgment normal.    Assessment & Plan  Richard Myers was seen today for hypertension and prediabetes.  Diagnoses and all orders for this visit:  Essential (primary) hypertension Well controlled BP goal - < 130/80 DIET: Limit salt intake, read nutrition labels to check salt content, limit fried and high fatty foods  Avoid using multisymptom OTC cold preparations that  generally contain sudafed which can rise BP. Consult with pharmacist on best cold relief products to use for persons with HTN EXERCISE Discussed incorporating exercise such as walking - 30 minutes most days of the week and can do in 10 minute intervals    -     CBC with Differential/Platelet -     CMP14+EGFR -     lisinopril -hydrochlorothiazide  (ZESTORETIC ) 20-25 MG tablet; Take 1 tablet by mouth every morning. -     amLODipine  (NORVASC ) 5 MG tablet; Take 1 tablet (5 mg total) by mouth daily.  Prediabetes Monitor carbohydrates -rice, potatoes, tortillas, [employer] Flour, breads, pasta, sweets, sodas.  Increase exercising to help maintain appropriate weight.  -     CMP14+EGFR -     Hemoglobin A1c -     Lipid panel  Dyslipidemia Hx of CVA on statin  -     Lipid panel  Nocturia -     PSA  Vitamin D  deficiency -     VITAMIN D  25 Hydroxy (Vit-D Deficiency, Fractures) -     Cholecalciferol (VITAMIN D3) 50 MCG (2000 UT) capsule; Take 1 capsule (2,000 Units total) by mouth daily.  Encounter for issue of repeat prescription -     lisinopril -hydrochlorothiazide  (ZESTORETIC ) 20-25 MG tablet; Take 1 tablet by mouth every morning. -     amLODipine  (NORVASC ) 5 MG tablet; Take 1 tablet (5 mg total) by mouth daily.  History of CVA (cerebrovascular accident) (HCC) -     clopidogrel  (PLAVIX ) 75 MG tablet; Take 1 tablet (75 mg total) by mouth daily. atorvastatin   Medication refill -     lisinopril -hydrochlorothiazide  (ZESTORETIC ) 20-25 MG tablet; Take 1 tablet by mouth every morning. -     clopidogrel  (PLAVIX ) 75 MG tablet; Take 1 tablet (75 mg total) by mouth daily. -     Cholecalciferol (VITAMIN D3) 50 MCG (2000 UT) capsule; Take 1 capsule (2,000 Units total) by mouth daily. -     amLODipine  (NORVASC ) 5 MG tablet; Take 1 tablet (5 mg total) by mouth daily.      Return in about 6 months (around 09/17/2024).  The patient was given clear instructions to go to ER or return to medical  center if symptoms don't improve, worsen or new problems develop. The patient verbalized understanding. The patient was told to call to get lab results if they haven't heard anything in the next week.   This note has been created with Education officer, environmental. Any transcriptional errors are unintentional.   Marius Siemens, NP  03/18/2024, 9:40 AM

## 2024-03-19 LAB — CMP14+EGFR
ALT: 20 IU/L (ref 0–44)
AST: 23 IU/L (ref 0–40)
Albumin: 4.1 g/dL (ref 3.9–4.9)
Alkaline Phosphatase: 91 IU/L (ref 44–121)
BUN/Creatinine Ratio: 16 (ref 10–24)
BUN: 17 mg/dL (ref 8–27)
Bilirubin Total: 0.3 mg/dL (ref 0.0–1.2)
CO2: 21 mmol/L (ref 20–29)
Calcium: 9.1 mg/dL (ref 8.6–10.2)
Chloride: 100 mmol/L (ref 96–106)
Creatinine, Ser: 1.06 mg/dL (ref 0.76–1.27)
Globulin, Total: 3.5 g/dL (ref 1.5–4.5)
Glucose: 76 mg/dL (ref 70–99)
Potassium: 4.1 mmol/L (ref 3.5–5.2)
Sodium: 139 mmol/L (ref 134–144)
Total Protein: 7.6 g/dL (ref 6.0–8.5)
eGFR: 76 mL/min/{1.73_m2} (ref >59)

## 2024-03-19 LAB — CBC WITH DIFFERENTIAL/PLATELET
Basophils Absolute: 0 10*3/uL (ref 0.0–0.2)
Basos: 1 %
EOS (ABSOLUTE): 0.2 10*3/uL (ref 0.0–0.4)
Eos: 6 %
Hematocrit: 42.8 % (ref 37.5–51.0)
Hemoglobin: 13.2 g/dL (ref 13.0–17.7)
Immature Grans (Abs): 0 10*3/uL (ref 0.0–0.1)
Immature Granulocytes: 0 %
Lymphocytes Absolute: 1.7 10*3/uL (ref 0.7–3.1)
Lymphs: 39 %
MCH: 24.2 pg — ABNORMAL LOW (ref 26.6–33.0)
MCHC: 30.8 g/dL — ABNORMAL LOW (ref 31.5–35.7)
MCV: 79 fL (ref 79–97)
Monocytes Absolute: 0.3 10*3/uL (ref 0.1–0.9)
Monocytes: 7 %
Neutrophils Absolute: 2.1 10*3/uL (ref 1.4–7.0)
Neutrophils: 47 %
Platelets: 256 10*3/uL (ref 150–450)
RBC: 5.45 x10E6/uL (ref 4.14–5.80)
RDW: 17.2 % — ABNORMAL HIGH (ref 11.6–15.4)
WBC: 4.4 10*3/uL (ref 3.4–10.8)

## 2024-03-19 LAB — LIPID PANEL
Chol/HDL Ratio: 5.7 ratio — ABNORMAL HIGH (ref 0.0–5.0)
Cholesterol, Total: 251 mg/dL — ABNORMAL HIGH (ref 100–199)
HDL: 44 mg/dL (ref >39)
LDL Chol Calc (NIH): 160 mg/dL — ABNORMAL HIGH (ref 0–99)
Triglycerides: 254 mg/dL — ABNORMAL HIGH (ref 0–149)
VLDL Cholesterol Cal: 47 mg/dL — ABNORMAL HIGH (ref 5–40)

## 2024-03-19 LAB — VITAMIN D 25 HYDROXY (VIT D DEFICIENCY, FRACTURES): Vit D, 25-Hydroxy: 41.2 ng/mL (ref 30.0–100.0)

## 2024-03-19 LAB — PSA: Prostate Specific Ag, Serum: 2.2 ng/mL (ref 0.0–4.0)

## 2024-03-19 LAB — HEMOGLOBIN A1C
Est. average glucose Bld gHb Est-mCnc: 120 mg/dL
Hgb A1c MFr Bld: 5.8 % — ABNORMAL HIGH (ref 4.8–5.6)

## 2024-03-20 ENCOUNTER — Ambulatory Visit (INDEPENDENT_AMBULATORY_CARE_PROVIDER_SITE_OTHER): Payer: Self-pay | Admitting: Primary Care

## 2024-03-20 DIAGNOSIS — E785 Hyperlipidemia, unspecified: Secondary | ICD-10-CM

## 2024-03-20 MED ORDER — ATORVASTATIN CALCIUM 40 MG PO TABS: 40.0000 mg | ORAL_TABLET | Freq: Every day | ORAL | 1 refills | Status: DC

## 2024-03-27 ENCOUNTER — Encounter (INDEPENDENT_AMBULATORY_CARE_PROVIDER_SITE_OTHER): Payer: Self-pay

## 2024-04-03 ENCOUNTER — Ambulatory Visit (INDEPENDENT_AMBULATORY_CARE_PROVIDER_SITE_OTHER)

## 2024-04-03 DIAGNOSIS — Z0000 Encounter for general adult medical examination without abnormal findings: Secondary | ICD-10-CM

## 2024-04-03 NOTE — Progress Notes (Unsigned)
 Subjective:   Richard Myers is a 68 y.o. male who presents for Medicare Annual/Subsequent preventive examination.  Visit Complete: {VISITMETHODVS:[phone]}  Patient Medicare AWV questionnaire was completed by the patient on ***; I have confirmed that all information answered by patient is correct and no changes since this date.        Objective:    There were no vitals filed for this visit. There is no height or weight on file to calculate BMI.     01/15/2023   10:36 AM 08/15/2020    7:25 PM 08/08/2020    6:00 PM  Advanced Directives  Does Patient Have a Medical Advance Directive? No No No  Would patient like information on creating a medical advance directive? Yes (ED - Information included in AVS) No - Patient declined No - Patient declined    Current Medications (verified) Outpatient Encounter Medications as of 04/03/2024  Medication Sig   amLODipine  (NORVASC ) 5 MG tablet Take 1 tablet (5 mg total) by mouth daily.   atorvastatin  (LIPITOR) 40 MG tablet Take 1 tablet (40 mg total) by mouth daily.   Cholecalciferol (VITAMIN D3) 50 MCG (2000 UT) capsule Take 1 capsule (2,000 Units total) by mouth daily.   clopidogrel  (PLAVIX ) 75 MG tablet Take 1 tablet (75 mg total) by mouth daily.   Iron , Ferrous Sulfate , 325 (65 Fe) MG TABS Take 325 mg by mouth daily.   lisinopril -hydrochlorothiazide  (ZESTORETIC ) 20-25 MG tablet Take 1 tablet by mouth every morning.   pantoprazole  (PROTONIX ) 40 MG tablet Take 1 tablet (40 mg total) by mouth daily.   No facility-administered encounter medications on file as of 04/03/2024.    Allergies (verified) Patient has no known allergies.   History: Past Medical History:  Diagnosis Date   GERD (gastroesophageal reflux disease)    Hyperlipemia    Hypertension    Stroke [facility])    Past Surgical History:  Procedure Laterality Date   NO PAST SURGERIES     Family History  Problem Relation Age of Onset   Arthritis Mother    Diabetes Mother     Hypertension Mother    Alcohol abuse Father    Social History   Socioeconomic History   Marital status: Single    Spouse name: Not on file   Number of children: 5   Years of education: Not on file   Highest education level: Not on file  Occupational History   Occupation: retired  Tobacco Use   Smoking status: Never   Smokeless tobacco: Never  Vaping Use   Vaping status: Never Used  Substance and Sexual Activity   Alcohol use: Not Currently    Comment: varies in amount   Drug use: No   Sexual activity: Yes  Other Topics Concern   Not on file  Social History Narrative   Not on file   Social Drivers of Health   Financial Resource Strain: Low Risk  (01/15/2023)   Overall Financial Resource Strain (CARDIA)    Difficulty of Paying Living Expenses: Not hard at all  Food Insecurity: No Food Insecurity (10/25/2023)   Hunger Vital Sign    Worried About Running Out of Food in the Last Year: Never true    Ran Out of Food in the Last Year: Never true  Transportation Needs: No Transportation Needs (10/25/2023)   PRAPARE - Administrator, Civil Service (Medical): No    Lack of Transportation (Non-Medical): No  Physical Activity: Insufficiently Active (01/15/2023)   Exercise Vital Sign  Days of Exercise per Week: 3 days    Minutes of Exercise per Session: 30 min  Stress: No Stress Concern Present (01/15/2023)   [employer] of Occupational Health - Occupational Stress Questionnaire    Feeling of Stress : Not at all  Social Connections: Moderately Isolated (01/15/2023)   Social Connection and Isolation Panel    Frequency of Communication with Friends and Family: More than three times a week    Frequency of Social Gatherings with Friends and Family: Once a week    Attends Religious Services: More than 4 times per year    Active Member of [employer] or Organizations: No    Attends Banker Meetings: Never    Marital Status: Never married    Tobacco  Counseling Counseling given: Not Answered   Clinical Intake:                        Activities of Daily Living     No data to display           Patient Care Team: Celestia Rosaline SQUIBB, NP as PCP - General (Internal Medicine)  Indicate any recent Medical Services you may have received from other than [facility] providers in the past year (date may be approximate).     Assessment:   This is a routine wellness examination for Richard Myers.  Hearing/Vision screen No results found.   Goals Addressed   None    Depression Screen    03/18/2024    9:21 AM 10/25/2023    9:34 AM 07/24/2023   10:09 AM 01/15/2023   10:37 AM 01/15/2023   10:36 AM 12/17/2022   10:15 AM 03/16/2022   10:55 AM  PHQ 2/9 Scores  PHQ - 2 Score 0 0 0 0 0 0 3  PHQ- 9 Score   1    5    Fall Risk    03/18/2024    9:21 AM 10/25/2023    9:34 AM 07/24/2023   10:09 AM 01/15/2023   10:36 AM 12/17/2022   10:15 AM  Fall Risk   Falls in the past year? 0 0 0 0 0  Number falls in past yr: 0 0 0 0 0  Injury with Fall? 0 0 0 0 0  Risk for fall due to : No Fall Risks No Fall Risks No Fall Risks No Fall Risks No Fall Risks  Follow up Falls evaluation completed   Falls prevention discussed     MEDICARE RISK AT HOME:    TIMED UP AND GO:  Was the test performed?  {AMBTIMEDUPGO:[id number]}    Cognitive Function:    01/15/2023   10:37 AM  MMSE - Mini Mental State Exam  Orientation to time 5  Orientation to Place 5  Registration 3  Attention/ Calculation 5  Recall 3  Language- name 2 objects 2  Language- repeat 1  Language- follow 3 step command 3  Language- read & follow direction 1  Write a sentence 1  Copy design 1  Total score 30        01/15/2023   10:37 AM 01/05/2022    2:09 PM  6CIT Screen  What Year? 0 points 4 points  What month? 0 points 3 points  What time? 0 points 3 points  Count back from 20 0 points 0 points  Months in reverse 0 points 0 points  Repeat phrase 0 points 0 points   Total Score 0 points 10 points  Immunizations Immunization History  Administered Date(s) Administered   Influenza,inj,Quad PF,6+ Mos 08/09/2020, 06/14/2021   Influenza-Unspecified 07/22/2023   PFIZER(Purple Top)SARS-COV-2 Vaccination 06/16/2020, 07/26/2020, 01/14/2021   PNEUMOCOCCAL CONJUGATE-20 10/25/2023   Pneumococcal Polysaccharide-23 03/14/2021   Tdap 11/14/2020   Unspecified SARS-COV-2 Vaccination 07/22/2023    {TDAP status:2101805}  {Flu Vaccine status:2101806}  {Pneumococcal vaccine status:2101807}  {Covid-19 vaccine status:2101808}  Qualifies for Shingles Vaccine? {YES/NO:21197}  Zostavax completed {YES/NO:21197}  {Shingrix Completed?:2101804}  Screening Tests Health Maintenance  Topic Date Due   Zoster Vaccines- Shingrix (1 of 2) Never done   COVID-19 Vaccine (5 - 2024-25 season) 04/03/2024 (Originally 01/20/2024)   INFLUENZA VACCINE  05/08/2024   Medicare Annual Wellness (AWV)  04/03/2025   DTaP/Tdap/Td (2 - Td or Tdap) 11/14/2030   Colonoscopy  05/14/2033   Pneumococcal Vaccine: 50+ Years  Completed   Hepatitis C Screening  Completed   Hepatitis B Vaccines  Aged Out   HPV VACCINES  Aged Out   Meningococcal B Vaccine  Aged Out    Health Maintenance  Health Maintenance Due  Topic Date Due   Zoster Vaccines- Shingrix (1 of 2) Never done    {Colorectal cancer screening:2101809}  Lung Cancer Screening: (Low Dose CT Chest recommended if Age 48-80 years, 20 pack-year currently smoking OR have quit w/in 15years.) {DOES NOT does:27190::does not} qualify.   Lung Cancer Screening Referral: ***  Additional Screening:  Hepatitis C Screening: {DOES NOT does:27190::does not} qualify; Completed ***  Vision Screening: Recommended annual ophthalmology exams for early detection of glaucoma and other disorders of the eye. Is the patient up to date with their annual eye exam?  {YES/NO:21197} Who is the provider or what is the name of the office in which the  patient attends annual eye exams? *** If pt is not established with a provider, would they like to be referred to a provider to establish care? {YES/NO:21197}.   Dental Screening: Recommended annual dental exams for proper oral hygiene  Diabetic Foot Exam: {Diabetic Foot Exam:2101802}  Community Resource Referral / Chronic Care Management: CRR required this visit?  {YES/NO:21197}  CCM required this visit?  {CCM Required choices:[phone]}     Plan:     I have personally reviewed and noted the following in the patient's chart:   Medical and social history Use of alcohol, tobacco or illicit drugs  Current medications and supplements including opioid prescriptions. {Opioid Prescriptions:[phone]} Functional ability and status Nutritional status Physical activity Advanced directives List of other physicians Hospitalizations, surgeries, and ER visits in previous 12 months Vitals Screenings to include cognitive, depression, and falls Referrals and appointments  In addition, I have reviewed and discussed with patient certain preventive protocols, quality metrics, and best practice recommendations. A written personalized care plan for preventive services as well as general preventive health recommendations were provided to patient.     Morene Meth, RN   04/03/2024   After Visit Summary: {CHL AMB AWV After Visit Summary:[id number]}  Nurse Notes: ***

## 2024-04-13 NOTE — Telephone Encounter (Signed)
 Copied from CRM [id number]. Topic: Clinical - Lab/Test Results >> Apr 13, 2024  1:07 PM Winona R wrote: Pt called to for his lab results as requested in the letter sent to his home. However he would like to know if this message can also be sent to him. I informed him it is available on his Mychart and sent a mychart activation link to his phone number. Pt understands all of his results

## 2024-04-23 DIAGNOSIS — D367 Benign neoplasm of other specified sites: Secondary | ICD-10-CM | POA: Diagnosis not present

## 2024-04-29 ENCOUNTER — Encounter (INDEPENDENT_AMBULATORY_CARE_PROVIDER_SITE_OTHER): Payer: Self-pay | Admitting: Primary Care

## 2024-04-29 ENCOUNTER — Ambulatory Visit (INDEPENDENT_AMBULATORY_CARE_PROVIDER_SITE_OTHER): Admitting: Primary Care

## 2024-04-29 VITALS — BP 122/80 | HR 79 | Resp 16 | Wt 182.2 lb

## 2024-04-29 DIAGNOSIS — D1721 Benign lipomatous neoplasm of skin and subcutaneous tissue of right arm: Secondary | ICD-10-CM

## 2024-04-29 DIAGNOSIS — I10 Essential (primary) hypertension: Secondary | ICD-10-CM

## 2024-04-29 NOTE — Progress Notes (Signed)
 [facility]   Richard Myers is a 68 y.o. male presents for hypertension evaluation. Denies shortness of breath, headaches, chest pain or lower extremity edema, sudden onset, vision changes, unilateral weakness, dizziness, paresthesias .  Patient's main concern is unable to donate plasma due to a mass on his  right arm explained to him this 6 months prior it was lymphoma versus cyst more likely lymphoma.  Patient was referred per his request to the dermatologist appointment was on 04/23/2024 noted that bump mass on right forearm was well circumscribed nontender round lesion right volar forearm near antecubital fossa most consistent with cyst/lymphoma.  Patient reports adherence with medications.  Dietary habits include: monitoring sodium intake  Exercise habits include:yes  Family / Social history: CVA maternal grand father  Cirrhosis of the liver father died at 76 (alcoholic)   Past Medical History:  Diagnosis Date   GERD (gastroesophageal reflux disease)    Hyperlipemia    Hypertension    Stroke [facility])    Past Surgical History:  Procedure Laterality Date   NO PAST SURGERIES     No Known Allergies Current Outpatient Medications on File Prior to Visit  Medication Sig Dispense Refill   amLODipine  (NORVASC ) 5 MG tablet Take 1 tablet (5 mg total) by mouth daily. 90 tablet 1   atorvastatin  (LIPITOR) 40 MG tablet Take 1 tablet (40 mg total) by mouth daily. 90 tablet 1   Cholecalciferol (VITAMIN D3) 50 MCG (2000 UT) capsule Take 1 capsule (2,000 Units total) by mouth daily. 90 capsule 1   clopidogrel  (PLAVIX ) 75 MG tablet Take 1 tablet (75 mg total) by mouth daily. 90 tablet 1   Iron , Ferrous Sulfate , 325 (65 Fe) MG TABS Take 325 mg by mouth daily. 90 tablet 1   lisinopril -hydrochlorothiazide  (ZESTORETIC ) 20-25 MG tablet Take 1 tablet by mouth every morning. 90 tablet 1   pantoprazole  (PROTONIX ) 40 MG tablet Take 1 tablet (40 mg total) by mouth daily. 90 tablet 1   No  current facility-administered medications on file prior to visit.   Social History   Socioeconomic History   Marital status: Single    Spouse name: Not on file   Number of children: 5   Years of education: Not on file   Highest education level: Not on file  Occupational History   Occupation: retired  Tobacco Use   Smoking status: Never   Smokeless tobacco: Never  Vaping Use   Vaping status: Never Used  Substance and Sexual Activity   Alcohol use: Not Currently    Comment: varies in amount   Drug use: No   Sexual activity: Yes  Other Topics Concern   Not on file  Social History Narrative   Not on file   Social Drivers of Health   Financial Resource Strain: Low Risk  (01/15/2023)   Overall Financial Resource Strain (CARDIA)    Difficulty of Paying Living Expenses: Not hard at all  Food Insecurity: No Food Insecurity (10/25/2023)   Hunger Vital Sign    Worried About Running Out of Food in the Last Year: Never true    Ran Out of Food in the Last Year: Never true  Transportation Needs: No Transportation Needs (10/25/2023)   PRAPARE - Administrator, Civil Service (Medical): No    Lack of Transportation (Non-Medical): No  Physical Activity: Insufficiently Active (01/15/2023)   Exercise Vital Sign    Days of Exercise per Week: 3 days    Minutes of Exercise per Session: 30  min  Stress: No Stress Concern Present (01/15/2023)   [employer] of Occupational Health - Occupational Stress Questionnaire    Feeling of Stress : Not at all  Social Connections: Moderately Isolated (01/15/2023)   Social Connection and Isolation Panel    Frequency of Communication with Friends and Family: More than three times a week    Frequency of Social Gatherings with Friends and Family: Once a week    Attends Religious Services: More than 4 times per year    Active Member of [employer] or Organizations: No    Attends Banker Meetings: Never    Marital Status: Never married   Intimate Partner Violence: Not At Risk (10/25/2023)   Humiliation, Afraid, Rape, and Kick questionnaire    Fear of Current or Ex-Partner: No    Emotionally Abused: No    Physically Abused: No    Sexually Abused: No   Family History  Problem Relation Age of Onset   Arthritis Mother    Diabetes Mother    Hypertension Mother    Alcohol abuse Father    Health Maintenance  Topic Date Due   Zoster Vaccines- Shingrix (1 of 2) Never done   COVID-19 Vaccine (5 - 2024-25 season) 01/20/2024   INFLUENZA VACCINE  05/08/2024   Medicare Annual Wellness (AWV)  04/03/2025   DTaP/Tdap/Td (2 - Td or Tdap) 11/14/2030   Colonoscopy  05/14/2033   Pneumococcal Vaccine: 50+ Years  Completed   Hepatitis C Screening  Completed   Hepatitis B Vaccines  Aged Out   HPV VACCINES  Aged Out   Meningococcal B Vaccine  Aged Out     OBJECTIVE:  Vitals:   04/29/24 0842  BP: 122/80  Pulse: 79  Resp: 16  SpO2: 97%  Weight: 182 lb 3.2 oz (82.6 kg)    Physical Exam Vitals reviewed.  Constitutional:      Appearance: He is obese.  HENT:     Head: Normocephalic.     Right Ear: External ear normal.     Left Ear: External ear normal.     Nose: Nose normal.  Eyes:     Extraocular Movements: Extraocular movements intact.     Pupils: Pupils are equal, round, and reactive to light.  Cardiovascular:     Rate and Rhythm: Normal rate and regular rhythm.  Pulmonary:     Effort: Pulmonary effort is normal.     Breath sounds: Normal breath sounds.  Abdominal:     General: Bowel sounds are normal. There is distension.     Palpations: Abdomen is soft.  Musculoskeletal:        General: Normal range of motion.     Cervical back: Normal range of motion and neck supple.  Skin:    General: Skin is warm and dry.     Findings: Lesion present.     Comments:  bump mass on right forearm was well circumscribed nontender round lesion right volar forearm near antecubital fossa most consistent with cyst/lymphoma.    Neurological:     Mental Status: He is oriented to person, place, and time.  Psychiatric:        Mood and Affect: Mood normal.        Behavior: Behavior normal.        Thought Content: Thought content normal.        Judgment: Judgment normal.      ROS  Last 3 Office BP readings: BP Readings from Last 3 Encounters:  04/29/24 122/80  03/18/24 123/82  10/25/23 120/79    BMET    Component Value Date/Time   NA 139 03/18/2024 0925   K 4.1 03/18/2024 0925   CL 100 03/18/2024 0925   CO2 21 03/18/2024 0925   GLUCOSE 76 03/18/2024 0925   GLUCOSE 94 05/11/2021 0911   BUN 17 03/18/2024 0925   CREATININE 1.06 03/18/2024 0925   CALCIUM  9.1 03/18/2024 0925   GFRNONAA 73 11/14/2020 1140   GFRNONAA >60 08/09/2020 0435   GFRAA 85 11/14/2020 1140    Renal function: CrCl cannot be calculated (Patient's most recent lab result is older than the maximum 21 days allowed.).  Clinical ASCVD: No  The ASCVD Risk score (Arnett DK, et al., 2019) failed to calculate for the following reasons:   Risk score cannot be calculated because patient has a medical history suggesting prior/existing ASCVD  ASCVD risk factors include- [place]   ASSESSMENT & PLAN: Richard Myers was seen today for medical clearance.  Diagnoses and all orders for this visit:  Essential hypertension Well controlled  Counseled on lifestyle modifications for blood pressure control including reduced dietary sodium, increased exercise, weight reduction and adequate sleep. Also, educated patient about the risk for cardiovascular events, stroke and heart attack. Also counseled patient about the importance of medication adherence.   Lipoma of right upper extremity Mass not allowing to donate plasma needs $$$ to help his grand daughter admitted to [facility]-   - This note has been created with Education officer, environmental. Any transcriptional errors are unintentional.   Rosaline SHAUNNA Bohr, NP 04/29/2024,  8:44 AM

## 2024-06-15 ENCOUNTER — Other Ambulatory Visit (INDEPENDENT_AMBULATORY_CARE_PROVIDER_SITE_OTHER): Payer: Self-pay | Admitting: Primary Care

## 2024-06-15 ENCOUNTER — Telehealth (INDEPENDENT_AMBULATORY_CARE_PROVIDER_SITE_OTHER): Payer: Self-pay | Admitting: Primary Care

## 2024-06-15 DIAGNOSIS — Z760 Encounter for issue of repeat prescription: Secondary | ICD-10-CM

## 2024-06-15 DIAGNOSIS — K219 Gastro-esophageal reflux disease without esophagitis: Secondary | ICD-10-CM

## 2024-06-15 NOTE — Telephone Encounter (Signed)
 Pt came in stating that he needs a refill on (Pantoprazole  SOD 40 MG). I told pt I would make a note a route to provider. Please advise.

## 2024-06-19 NOTE — Telephone Encounter (Signed)
 Rx sent to [facility] on 06/15/2024

## 2024-07-13 ENCOUNTER — Other Ambulatory Visit (INDEPENDENT_AMBULATORY_CARE_PROVIDER_SITE_OTHER): Payer: Self-pay | Admitting: Primary Care

## 2024-07-13 DIAGNOSIS — Z760 Encounter for issue of repeat prescription: Secondary | ICD-10-CM

## 2024-07-13 DIAGNOSIS — K219 Gastro-esophageal reflux disease without esophagitis: Secondary | ICD-10-CM

## 2024-07-14 NOTE — Telephone Encounter (Signed)
 Requested Prescriptions  Pending Prescriptions Disp Refills   pantoprazole  (PROTONIX ) 40 MG tablet [Pharmacy Med Name: pantoprazole  40 mg tablet,delayed release] 90 tablet 0    Sig: Take 1 Tablet by mouth once daily. needs a appt FOR more refills     Gastroenterology: Proton Pump Inhibitors Passed - 07/14/2024  4:46 PM      Passed - Valid encounter within last 12 months    Recent Outpatient Visits           2 months ago Essential hypertension   [place] [facility] Celestia Rosaline SQUIBB, NP   3 months ago Essential (primary) hypertension   [place] [facility] Celestia Rosaline SQUIBB, NP   8 months ago Encounter for immunization   [place] [facility] Celestia Rosaline SQUIBB, NP   11 months ago Essential (primary) hypertension   [place] [facility] Celestia Rosaline SQUIBB, NP   1 year ago Colon cancer screening   [place] [facility] Celestia Rosaline SQUIBB, NP

## 2024-08-26 ENCOUNTER — Other Ambulatory Visit (INDEPENDENT_AMBULATORY_CARE_PROVIDER_SITE_OTHER): Payer: Self-pay | Admitting: Primary Care

## 2024-08-26 DIAGNOSIS — Z760 Encounter for issue of repeat prescription: Secondary | ICD-10-CM

## 2024-08-26 DIAGNOSIS — K219 Gastro-esophageal reflux disease without esophagitis: Secondary | ICD-10-CM

## 2024-09-17 ENCOUNTER — Encounter (INDEPENDENT_AMBULATORY_CARE_PROVIDER_SITE_OTHER): Payer: Self-pay | Admitting: Primary Care

## 2024-09-17 ENCOUNTER — Ambulatory Visit (INDEPENDENT_AMBULATORY_CARE_PROVIDER_SITE_OTHER): Admitting: Primary Care

## 2024-09-17 VITALS — BP 118/78 | HR 92 | Resp 16 | Wt 177.2 lb

## 2024-09-17 DIAGNOSIS — Z23 Encounter for immunization: Secondary | ICD-10-CM | POA: Diagnosis not present

## 2024-09-17 DIAGNOSIS — I10 Essential (primary) hypertension: Secondary | ICD-10-CM | POA: Diagnosis not present

## 2024-09-17 DIAGNOSIS — R7303 Prediabetes: Secondary | ICD-10-CM | POA: Diagnosis not present

## 2024-09-17 DIAGNOSIS — E785 Hyperlipidemia, unspecified: Secondary | ICD-10-CM

## 2024-09-17 NOTE — Progress Notes (Unsigned)
 [facility]  Richard Myers, is a 68 y.o. male  RDW:[id number]  FMW:[id number]  DOB - 1955-11-27  Chief Complaint  Patient presents with   Hypertension       Subjective:   Richard Myers is a 68 y.o. male here today for a follow up visit. Patient has No headache, No chest pain, No abdominal pain - No Nausea, No new weakness tingling or numbness, No Cough - shortness of breath Hypertension    No problems updated.  Comprehensive ROS Pertinent positive and negative noted in HPI   Allergies[1]  Past Medical History:  Diagnosis Date   GERD (gastroesophageal reflux disease)    Hyperlipemia    Hypertension    Stroke [facility])     Medications Ordered Prior to Encounter[2] Health Maintenance  Topic Date Due   Flu Shot  05/08/2024   Zoster (Shingles) Vaccine (2 of 2) 05/13/2024   COVID-19 Vaccine (5 - 2025-26 season) 06/08/2024   Medicare Annual Wellness Visit  04/03/2025   DTaP/Tdap/Td vaccine (2 - Td or Tdap) 11/14/2030   Colon Cancer Screening  05/14/2033   Pneumococcal Vaccine for age over 23  Completed   Hepatitis C Screening  Completed   Meningitis B Vaccine  Aged Out    Objective:   Vitals:   09/17/24 1028  BP: 118/78  Pulse: 92  Resp: 16  SpO2: 98%  Weight: 177 lb 3.2 oz (80.4 kg)   {Vitals History (Optional):23777}  Physical Exam Vitals reviewed.  Constitutional:      Appearance: He is obese.  HENT:     Head: Normocephalic.     Right Ear: Tympanic membrane and external ear normal.     Left Ear: Tympanic membrane and external ear normal.     Nose: Nose normal.  Eyes:     Extraocular Movements: Extraocular movements intact.     Pupils: Pupils are equal, round, and reactive to light.  Cardiovascular:     Rate and Rhythm: Normal rate and regular rhythm.  Pulmonary:     Effort: Pulmonary effort is normal.     Breath sounds: Normal breath sounds.  Abdominal:     General: Bowel sounds are normal. There is distension.      Palpations: Abdomen is soft.  Musculoskeletal:        General: Normal range of motion.  Skin:    General: Skin is warm and dry.  Neurological:     Mental Status: He is oriented to person, place, and time.  Psychiatric:        Mood and Affect: Mood normal.        Behavior: Behavior normal.        Thought Content: Thought content normal.        Judgment: Judgment normal.     {Labs (Optional):23779}  Assessment & Plan  Essential hypertension -     CBC with Differential/Platelet -     CMP14+EGFR  Prediabetes -     CMP14+EGFR -     Hemoglobin A1c -     Lipid panel  Dyslipidemia -     Lipid panel     Patient have been counseled extensively about nutrition and exercise. Other issues discussed during this visit include: low cholesterol diet, weight control and daily exercise, foot care, annual eye examinations at Ophthalmology, importance of adherence with medications and regular follow-up. We also discussed long term complications of uncontrolled diabetes and hypertension.   No follow-ups on file.  The patient was given clear instructions to go to ER  or return to medical center if symptoms don't improve, worsen or new problems develop. The patient verbalized understanding. The patient was told to call to get lab results if they haven't heard anything in the next week.   This note has been created with Education officer, environmental. Any transcriptional errors are unintentional.   Rosaline SHAUNNA Bohr, NP 09/17/2024, 10:56 AM    [1] No Known Allergies [2]  Current Outpatient Medications on File Prior to Visit  Medication Sig Dispense Refill   amLODipine  (NORVASC ) 5 MG tablet Take 1 tablet (5 mg total) by mouth daily. 90 tablet 1   atorvastatin  (LIPITOR) 40 MG tablet Take 1 tablet (40 mg total) by mouth daily. 90 tablet 1   Cholecalciferol (VITAMIN D3) 50 MCG (2000 UT) capsule Take 1 capsule (2,000 Units total) by mouth daily. 90 capsule 1    clopidogrel  (PLAVIX ) 75 MG tablet Take 1 tablet (75 mg total) by mouth daily. 90 tablet 1   Iron , Ferrous Sulfate , 325 (65 Fe) MG TABS Take 325 mg by mouth daily. 90 tablet 1   lisinopril -hydrochlorothiazide  (ZESTORETIC ) 20-25 MG tablet Take 1 tablet by mouth every morning. 90 tablet 1   pantoprazole  (PROTONIX ) 40 MG tablet Take 1 Tablet by mouth once daily. needs a appt FOR more refills 90 tablet 0   No current facility-administered medications on file prior to visit.

## 2024-09-18 LAB — CBC WITH DIFFERENTIAL/PLATELET
Basophils Absolute: 0 x10E3/uL (ref 0.0–0.2)
Basos: 1 %
EOS (ABSOLUTE): 0.1 x10E3/uL (ref 0.0–0.4)
Eos: 3 %
Hematocrit: 43.7 % (ref 37.5–51.0)
Hemoglobin: 13.8 g/dL (ref 13.0–17.7)
Immature Grans (Abs): 0 x10E3/uL (ref 0.0–0.1)
Immature Granulocytes: 0 %
Lymphocytes Absolute: 1.4 x10E3/uL (ref 0.7–3.1)
Lymphs: 38 %
MCH: 24 pg — ABNORMAL LOW (ref 26.6–33.0)
MCHC: 31.6 g/dL (ref 31.5–35.7)
MCV: 76 fL — ABNORMAL LOW (ref 79–97)
Monocytes Absolute: 0.3 x10E3/uL (ref 0.1–0.9)
Monocytes: 8 %
Neutrophils Absolute: 1.8 x10E3/uL (ref 1.4–7.0)
Neutrophils: 50 %
Platelets: 253 x10E3/uL (ref 150–450)
RBC: 5.76 x10E6/uL (ref 4.14–5.80)
RDW: 16.6 % — ABNORMAL HIGH (ref 11.6–15.4)
WBC: 3.6 x10E3/uL (ref 3.4–10.8)

## 2024-09-18 LAB — CMP14+EGFR
ALT: 17 IU/L (ref 0–44)
AST: 25 IU/L (ref 0–40)
Albumin: 4.4 g/dL (ref 3.9–4.9)
Alkaline Phosphatase: 93 IU/L (ref 47–123)
BUN/Creatinine Ratio: 19 (ref 10–24)
BUN: 19 mg/dL (ref 8–27)
Bilirubin Total: 1 mg/dL (ref 0.0–1.2)
CO2: 25 mmol/L (ref 20–29)
Calcium: 9.2 mg/dL (ref 8.6–10.2)
Chloride: 101 mmol/L (ref 96–106)
Creatinine, Ser: 0.98 mg/dL (ref 0.76–1.27)
Globulin, Total: 3.1 g/dL (ref 1.5–4.5)
Glucose: 114 mg/dL — ABNORMAL HIGH (ref 70–99)
Potassium: 3.9 mmol/L (ref 3.5–5.2)
Sodium: 141 mmol/L (ref 134–144)
Total Protein: 7.5 g/dL (ref 6.0–8.5)
eGFR: 84 mL/min/1.73 (ref >59)

## 2024-09-18 LAB — LIPID PANEL
Chol/HDL Ratio: 2.9 ratio (ref 0.0–5.0)
Cholesterol, Total: 150 mg/dL (ref 100–199)
HDL: 51 mg/dL (ref >39)
LDL Chol Calc (NIH): 76 mg/dL (ref 0–99)
Triglycerides: 128 mg/dL (ref 0–149)
VLDL Cholesterol Cal: 23 mg/dL (ref 5–40)

## 2024-09-18 LAB — HEMOGLOBIN A1C
Est. average glucose Bld gHb Est-mCnc: 120 mg/dL
Hgb A1c MFr Bld: 5.8 % — ABNORMAL HIGH (ref 4.8–5.6)

## 2024-09-24 ENCOUNTER — Ambulatory Visit (INDEPENDENT_AMBULATORY_CARE_PROVIDER_SITE_OTHER): Payer: Self-pay | Admitting: Primary Care

## 2024-09-24 DIAGNOSIS — E785 Hyperlipidemia, unspecified: Secondary | ICD-10-CM

## 2024-09-24 MED ORDER — ATORVASTATIN CALCIUM 40 MG PO TABS: 40.0000 mg | ORAL_TABLET | Freq: Every day | ORAL | 1 refills | Status: AC

## 2024-09-25 ENCOUNTER — Other Ambulatory Visit (INDEPENDENT_AMBULATORY_CARE_PROVIDER_SITE_OTHER): Payer: Self-pay | Admitting: Primary Care

## 2024-09-25 DIAGNOSIS — I639 Cerebral infarction, unspecified: Secondary | ICD-10-CM

## 2024-09-25 DIAGNOSIS — Z760 Encounter for issue of repeat prescription: Secondary | ICD-10-CM

## 2024-10-16 ENCOUNTER — Other Ambulatory Visit (INDEPENDENT_AMBULATORY_CARE_PROVIDER_SITE_OTHER): Payer: Self-pay | Admitting: Primary Care

## 2024-10-16 DIAGNOSIS — K219 Gastro-esophageal reflux disease without esophagitis: Secondary | ICD-10-CM

## 2024-10-16 DIAGNOSIS — Z760 Encounter for issue of repeat prescription: Secondary | ICD-10-CM

## 2024-10-27 ENCOUNTER — Telehealth (INDEPENDENT_AMBULATORY_CARE_PROVIDER_SITE_OTHER): Payer: Self-pay

## 2024-10-27 NOTE — Telephone Encounter (Signed)
 Received faxed from [facility] in regards to C-SNP verification   Reached out to [facility] and spoke with Ley A and did th C-SNP Verification   Reference #[id number]

## 2024-11-03 ENCOUNTER — Ambulatory Visit (INDEPENDENT_AMBULATORY_CARE_PROVIDER_SITE_OTHER): Admitting: Primary Care

## 2025-03-18 ENCOUNTER — Ambulatory Visit (INDEPENDENT_AMBULATORY_CARE_PROVIDER_SITE_OTHER): Payer: Self-pay | Admitting: Primary Care
